# Patient Record
Sex: Female | Born: 1960 | Race: White | Hispanic: No | Marital: Married | Smoking: Never smoker
Health system: Southern US, Community
[De-identification: ages and names within clinical notes are randomized; demographics above are authoritative.]

## PROBLEM LIST (undated history)

## (undated) DIAGNOSIS — M069 Rheumatoid arthritis, unspecified: Secondary | ICD-10-CM

## (undated) DIAGNOSIS — R12 Heartburn: Secondary | ICD-10-CM

## (undated) DIAGNOSIS — F419 Anxiety disorder, unspecified: Secondary | ICD-10-CM

## (undated) DIAGNOSIS — R300 Dysuria: Secondary | ICD-10-CM

## (undated) DIAGNOSIS — G4733 Obstructive sleep apnea (adult) (pediatric): Secondary | ICD-10-CM

## (undated) DIAGNOSIS — G2581 Restless legs syndrome: Secondary | ICD-10-CM

## (undated) DIAGNOSIS — E785 Hyperlipidemia, unspecified: Secondary | ICD-10-CM

## (undated) DIAGNOSIS — D869 Sarcoidosis, unspecified: Secondary | ICD-10-CM

## (undated) DIAGNOSIS — R112 Nausea with vomiting, unspecified: Secondary | ICD-10-CM

## (undated) DIAGNOSIS — E039 Hypothyroidism, unspecified: Secondary | ICD-10-CM

## (undated) DIAGNOSIS — K219 Gastro-esophageal reflux disease without esophagitis: Secondary | ICD-10-CM

## (undated) DIAGNOSIS — M1990 Unspecified osteoarthritis, unspecified site: Secondary | ICD-10-CM

## (undated) DIAGNOSIS — G43909 Migraine, unspecified, not intractable, without status migrainosus: Secondary | ICD-10-CM

## (undated) DIAGNOSIS — D759 Disease of blood and blood-forming organs, unspecified: Secondary | ICD-10-CM

## (undated) DIAGNOSIS — Z9889 Other specified postprocedural states: Secondary | ICD-10-CM

## (undated) DIAGNOSIS — I10 Essential (primary) hypertension: Secondary | ICD-10-CM

## (undated) DIAGNOSIS — E7800 Pure hypercholesterolemia, unspecified: Secondary | ICD-10-CM

## (undated) DIAGNOSIS — F32A Depression, unspecified: Secondary | ICD-10-CM

## (undated) DIAGNOSIS — E663 Overweight: Secondary | ICD-10-CM

## (undated) DIAGNOSIS — I82409 Acute embolism and thrombosis of unspecified deep veins of unspecified lower extremity: Secondary | ICD-10-CM

## (undated) HISTORY — DX: Hypothyroidism, unspecified: E03.9

## (undated) HISTORY — DX: Anxiety disorder, unspecified: F41.9

## (undated) HISTORY — DX: Restless legs syndrome: G25.81

## (undated) HISTORY — PX: OTHER SURGICAL HISTORY: SHX169

## (undated) HISTORY — PX: SPLENECTOMY: SUR1306

## (undated) HISTORY — DX: Rheumatoid arthritis, unspecified: M06.9

## (undated) HISTORY — DX: Obstructive sleep apnea (adult) (pediatric): G47.33

## (undated) HISTORY — DX: Heartburn: R12

## (undated) HISTORY — DX: Overweight: E66.3

## (undated) HISTORY — DX: Hyperlipidemia, unspecified: E78.5

## (undated) HISTORY — DX: Acute embolism and thrombosis of unspecified deep veins of unspecified lower extremity: I82.409

## (undated) HISTORY — DX: Dysuria: R30.0

## (undated) HISTORY — DX: Essential (primary) hypertension: I10

## (undated) HISTORY — DX: Unspecified osteoarthritis, unspecified site: M19.90

## (undated) HISTORY — DX: Depression, unspecified: F32.A

## (undated) HISTORY — DX: Sarcoidosis, unspecified: D86.9

## (undated) HISTORY — DX: Pure hypercholesterolemia, unspecified: E78.00

## (undated) HISTORY — DX: Migraine, unspecified, not intractable, without status migrainosus: G43.909

---

## 1990-11-17 HISTORY — PX: LYMPH NODE BIOPSY: SHX201

## 1999-08-21 ENCOUNTER — Other Ambulatory Visit: Admission: RE | Admit: 1999-08-21 | Discharge: 1999-08-21 | Payer: Self-pay | Admitting: Family Medicine

## 2001-03-30 ENCOUNTER — Ambulatory Visit (HOSPITAL_COMMUNITY): Admission: RE | Admit: 2001-03-30 | Discharge: 2001-03-30 | Payer: Self-pay | Admitting: Unknown Physician Specialty

## 2001-03-30 ENCOUNTER — Encounter: Payer: Self-pay | Admitting: Unknown Physician Specialty

## 2001-11-17 HISTORY — PX: TOTAL ABDOMINAL HYSTERECTOMY: SHX209

## 2002-04-13 ENCOUNTER — Ambulatory Visit (HOSPITAL_COMMUNITY): Admission: RE | Admit: 2002-04-13 | Discharge: 2002-04-13 | Payer: Self-pay | Admitting: Unknown Physician Specialty

## 2002-04-13 ENCOUNTER — Encounter: Payer: Self-pay | Admitting: Unknown Physician Specialty

## 2003-04-20 ENCOUNTER — Encounter: Payer: Self-pay | Admitting: Unknown Physician Specialty

## 2003-04-20 ENCOUNTER — Ambulatory Visit (HOSPITAL_COMMUNITY): Admission: RE | Admit: 2003-04-20 | Discharge: 2003-04-20 | Payer: Self-pay | Admitting: Unknown Physician Specialty

## 2006-05-04 ENCOUNTER — Ambulatory Visit: Admission: RE | Admit: 2006-05-04 | Discharge: 2006-05-04 | Payer: Self-pay | Admitting: Internal Medicine

## 2006-05-21 ENCOUNTER — Ambulatory Visit: Payer: Self-pay | Admitting: Pulmonary Disease

## 2006-06-19 ENCOUNTER — Ambulatory Visit: Payer: Self-pay | Admitting: Pulmonary Disease

## 2006-08-13 ENCOUNTER — Ambulatory Visit (HOSPITAL_COMMUNITY): Admission: RE | Admit: 2006-08-13 | Discharge: 2006-08-13 | Payer: Self-pay | Admitting: Internal Medicine

## 2007-04-16 ENCOUNTER — Ambulatory Visit: Payer: Self-pay | Admitting: Pulmonary Disease

## 2007-12-30 DIAGNOSIS — E039 Hypothyroidism, unspecified: Secondary | ICD-10-CM

## 2007-12-30 DIAGNOSIS — I10 Essential (primary) hypertension: Secondary | ICD-10-CM

## 2007-12-30 DIAGNOSIS — G4733 Obstructive sleep apnea (adult) (pediatric): Secondary | ICD-10-CM | POA: Insufficient documentation

## 2007-12-30 DIAGNOSIS — E663 Overweight: Secondary | ICD-10-CM | POA: Insufficient documentation

## 2007-12-30 DIAGNOSIS — D869 Sarcoidosis, unspecified: Secondary | ICD-10-CM | POA: Insufficient documentation

## 2008-01-25 ENCOUNTER — Ambulatory Visit (HOSPITAL_COMMUNITY): Admission: RE | Admit: 2008-01-25 | Discharge: 2008-01-25 | Payer: Self-pay | Admitting: Obstetrics and Gynecology

## 2009-02-06 ENCOUNTER — Encounter: Admission: RE | Admit: 2009-02-06 | Discharge: 2009-02-06 | Payer: Self-pay | Admitting: Otolaryngology

## 2009-09-11 ENCOUNTER — Encounter (HOSPITAL_COMMUNITY): Admission: RE | Admit: 2009-09-11 | Discharge: 2009-10-11 | Payer: Self-pay | Admitting: Oncology

## 2009-09-11 ENCOUNTER — Ambulatory Visit (HOSPITAL_COMMUNITY): Payer: Self-pay | Admitting: Oncology

## 2010-01-14 ENCOUNTER — Ambulatory Visit (HOSPITAL_COMMUNITY): Payer: Self-pay | Admitting: Oncology

## 2010-01-14 ENCOUNTER — Encounter (HOSPITAL_COMMUNITY): Admission: RE | Admit: 2010-01-14 | Discharge: 2010-02-13 | Payer: Self-pay | Admitting: Oncology

## 2010-09-18 ENCOUNTER — Ambulatory Visit (HOSPITAL_COMMUNITY): Admission: RE | Admit: 2010-09-18 | Discharge: 2010-09-18 | Payer: Self-pay | Admitting: Internal Medicine

## 2011-02-06 LAB — CBC
Hemoglobin: 12.6 g/dL (ref 12.0–15.0)
RDW: 14.1 % (ref 11.5–15.5)
WBC: 12.3 10*3/uL — ABNORMAL HIGH (ref 4.0–10.5)

## 2011-02-06 LAB — DIFFERENTIAL
Basophils Absolute: 0.1 10*3/uL (ref 0.0–0.1)
Lymphocytes Relative: 37 % (ref 12–46)
Lymphs Abs: 4.5 10*3/uL — ABNORMAL HIGH (ref 0.7–4.0)
Monocytes Absolute: 0.9 10*3/uL (ref 0.1–1.0)
Neutro Abs: 6.6 10*3/uL (ref 1.7–7.7)

## 2011-02-20 LAB — IRON AND TIBC
Iron: 75 ug/dL (ref 42–135)
TIBC: 277 ug/dL (ref 250–470)

## 2011-02-20 LAB — CBC
HCT: 36.4 % (ref 36.0–46.0)
Hemoglobin: 12.3 g/dL (ref 12.0–15.0)
MCHC: 33.8 g/dL (ref 30.0–36.0)
RBC: 3.91 MIL/uL (ref 3.87–5.11)

## 2011-02-20 LAB — COMPREHENSIVE METABOLIC PANEL
ALT: 17 U/L (ref 0–35)
Alkaline Phosphatase: 69 U/L (ref 39–117)
BUN: 14 mg/dL (ref 6–23)
CO2: 25 mEq/L (ref 19–32)
GFR calc non Af Amer: >60 mL/min (ref >60)
Glucose, Bld: 115 mg/dL — ABNORMAL HIGH (ref 70–99)
Potassium: 3.6 mEq/L (ref 3.5–5.1)
Sodium: 139 mEq/L (ref 135–145)

## 2011-02-20 LAB — DIFFERENTIAL
Basophils Absolute: 0.1 10*3/uL (ref 0.0–0.1)
Basophils Relative: 1 % (ref 0–1)
Eosinophils Absolute: 0.2 10*3/uL (ref 0.0–0.7)
Neutrophils Relative %: 45 % (ref 43–77)

## 2011-02-20 LAB — LACTATE DEHYDROGENASE: LDH: 150 U/L (ref 94–250)

## 2011-02-20 LAB — ANGIOTENSIN CONVERTING ENZYME: Angiotensin-Converting Enzyme: 35 U/L (ref 9–67)

## 2011-03-02 IMAGING — MG MM DIGITAL SCREENING
5 series · 5 of 5 positions shown · non-contrast
Comparison: none

DG SCREEN MAMMOGRAM BILATERAL
Bilateral CC and MLO view(s) were taken.

DIGITAL SCREENING MAMMOGRAM WITH CAD:
The breast tissue is almost entirely fatty.  No masses or malignant type calcifications are 
identified.  Compared with prior studies.
Images were processed with CAD.

[L CC]
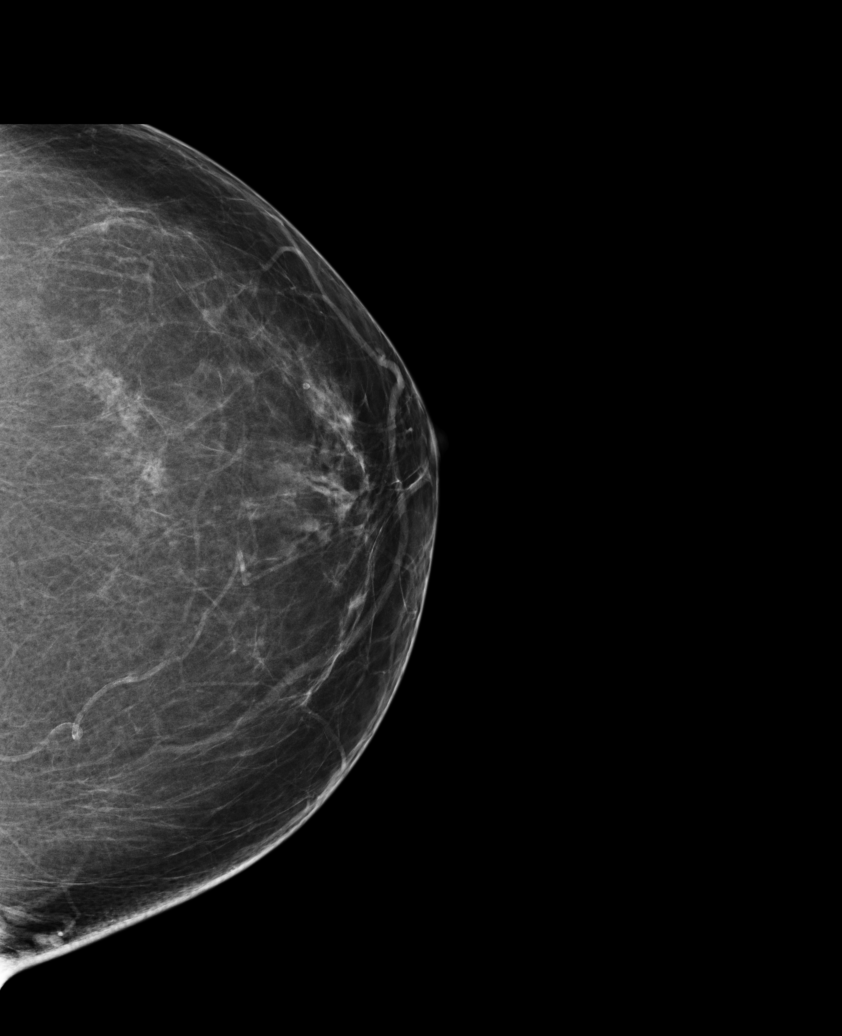

[L MLO (1 of 2)]
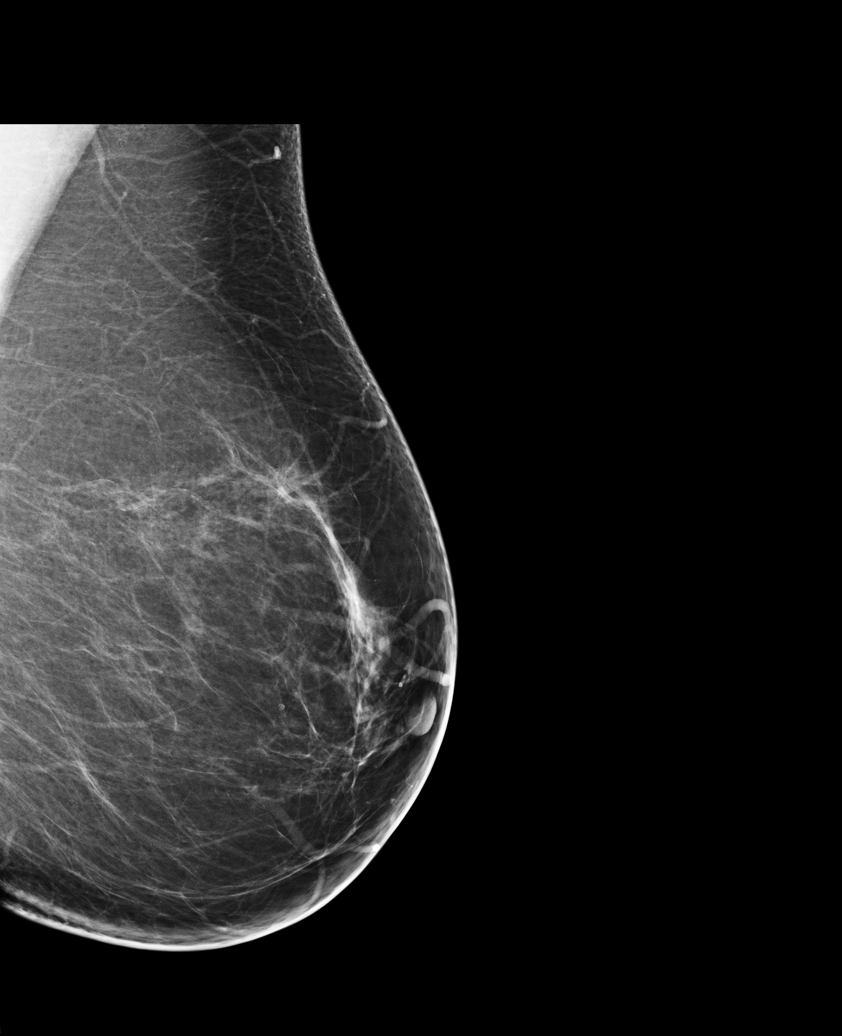

[R CC]
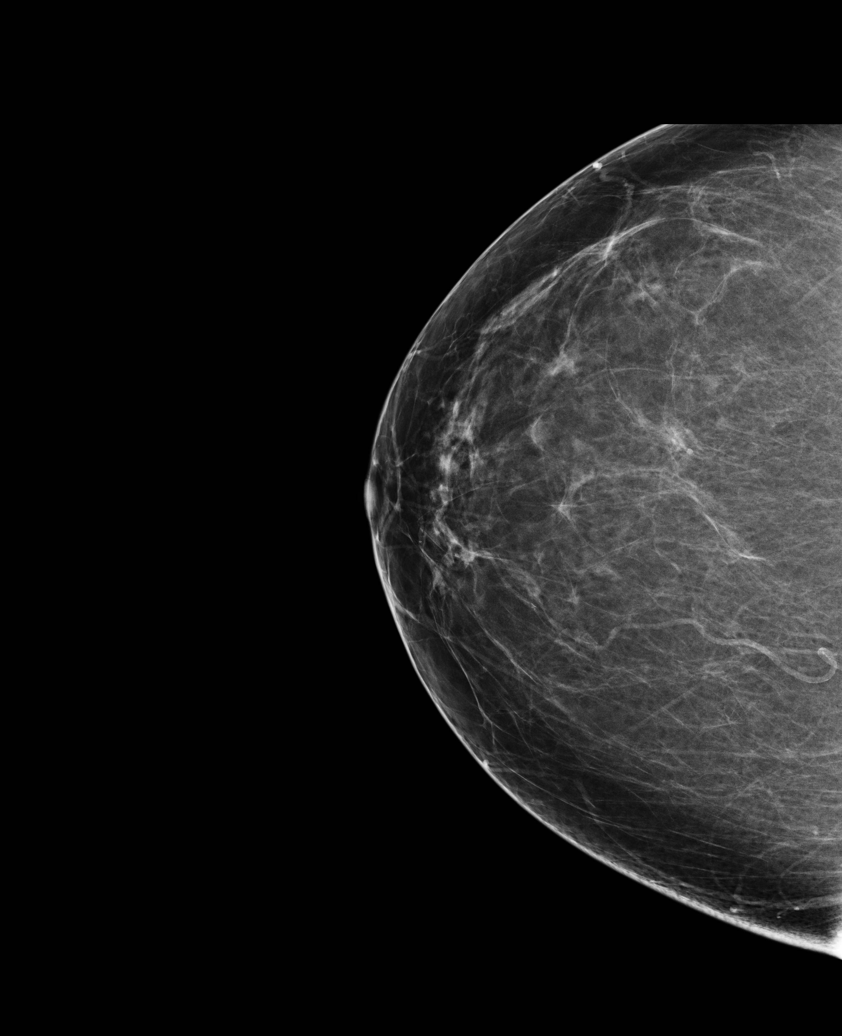

[R MLO]
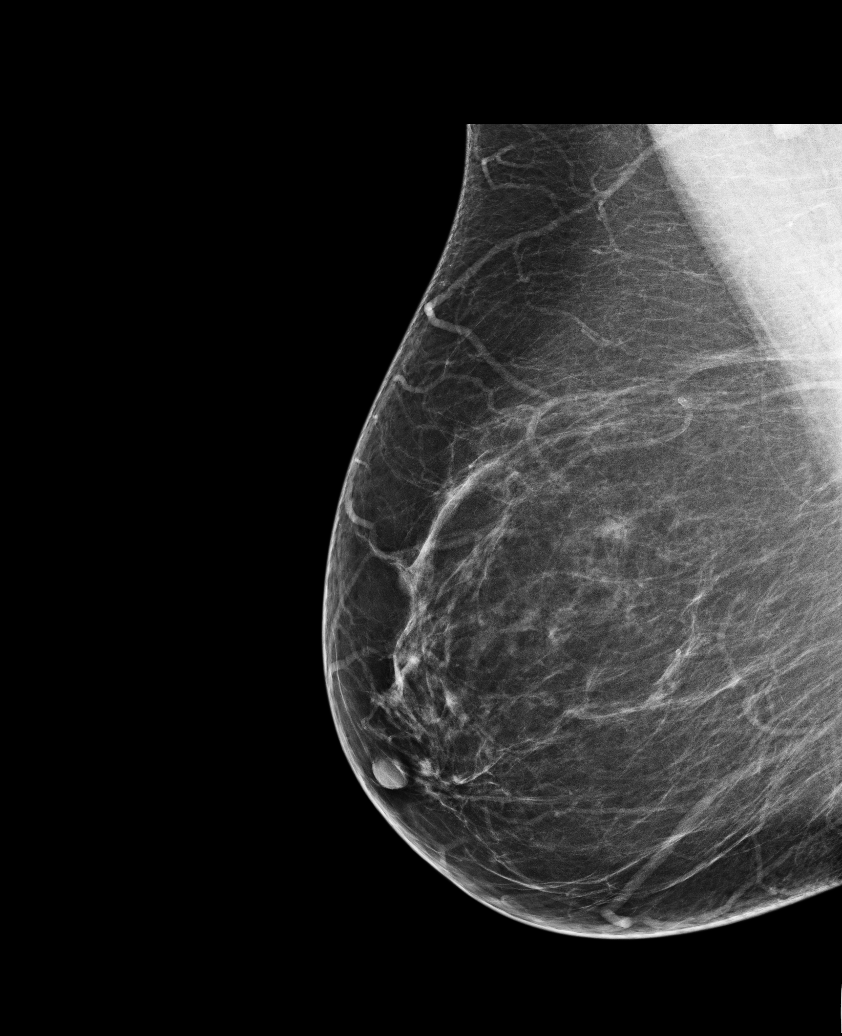

[L MLO (2 of 2)]
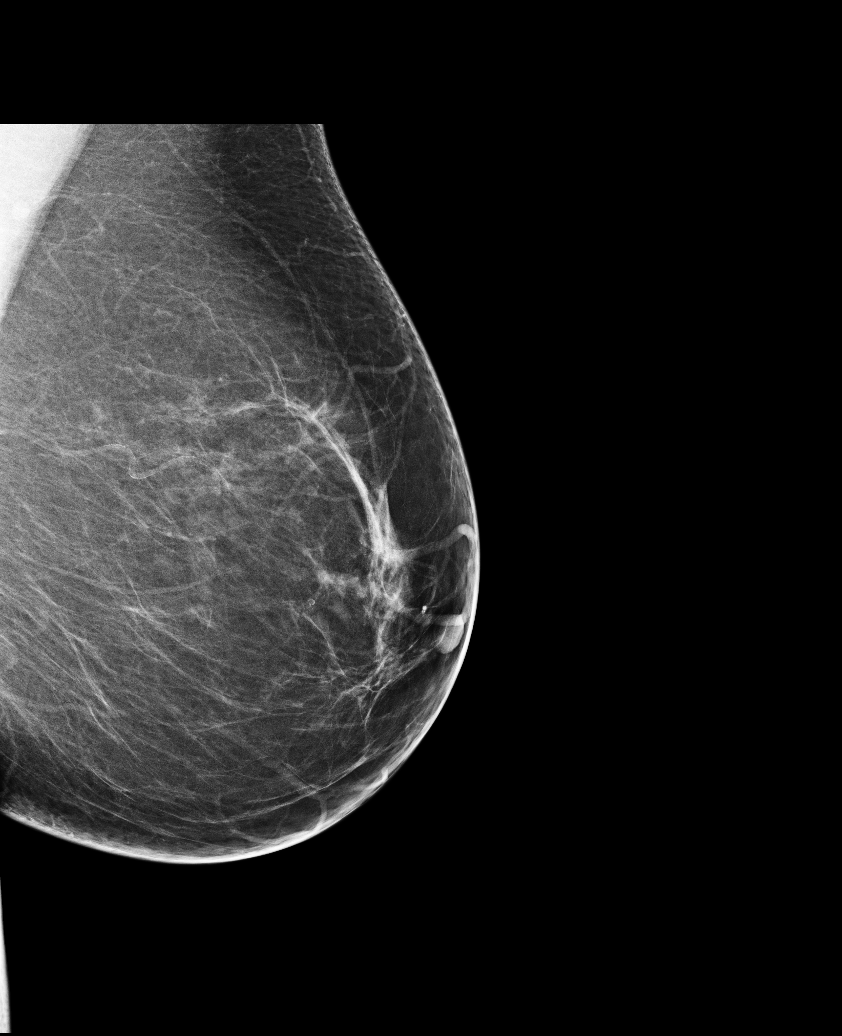

[5 of 5 positions shown; findings below may reference images not displayed]

IMPRESSION: No specific mammographic evidence of malignancy.  Next screening mammogram is recommended in one 
year.

A result letter of this screening mammogram will be mailed directly to the patient.

ASSESSMENT: Negative - BI-RADS 1

Screening mammogram in 1 year.
,

## 2011-04-04 NOTE — Assessment & Plan Note (Signed)
[facility]                               PULMONARY OFFICE NOTE   NAME:HATFIELDNusaiba, Guallpa                    MRN:          [id number]  DATE:06/19/2006                            DOB:          02-02-1961    HISTORY OF PRESENT ILLNESS:  The patient is a 50 year old white female who I  have been asked to see for obstructive sleep apnea.  She has recently  undergone nocturnal polysomnography on May 04, 2006, where on a split night  study she was found to have a Respiratory Disturbance Index with 26 events  per hour with O2 desaturation as low as 88%.  She was then placed on CPAP  and titrated to a level of 7 with what appears to be good control.  Patient  states that she typically goes to bed between 9:30 and 10:00 and gets up at  6:00 a.m. to start her day.  She gets tired in the morning upon arising.  She has been noted to have loud snoring and pauses in her breathing during  sleep.  Her husband sleeps in the living room on many nights.  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  She will get sleepy while  driving and will fall asleep with TV and movies.  It should be noted that  she does have a history of hypothyroidism, and Dr. Ouida Sills recently just  increased her Synthroid.  Patient also notes that she has gained  approximately 20-30 pounds over the last year.   PAST MEDICAL HISTORY:  1.  Hypertension.  2.  History of sarcoidosis, which has resolved.  3.  Status post splenectomy.  4.  Status post tubal ligation and hysterectomy.  5.  History of hyperthyroidism, as stated above.   MEDICATIONS:  1.  Synthroid 0.75 mg daily.  2.  Lexapro 20 mg daily.  3.  Mirapex of unknown dosage, nightly.   Patient has no known drug allergies.   SOCIAL HISTORY:  She is married and has children.  She has never smoked.   FAMILY HISTORY:  Remarkable for her having had  emphysema, and her son and  mother having had asthma.  Her mother and father also had heart disease.   REVIEW OF SYSTEMS:  As per history of present illness.  Also, see patient  intake form in the chart.   PHYSICAL EXAMINATION:  GENERAL:  She is an overweight white female in no  acute distress.  VITAL SIGNS:  Blood pressure 110/76, pulse 69, temperature 98.3.  Weight is  197 pounds.  O2 saturation on room air is 99%.  HEENT:  Pupils are equal, round and reactive to light and accommodation.  Extraocular muscles are intact.  Nares are somewhat narrow but patent.  Oropharynx:  She has a very small oral opening that appears to be very  crowded with elongation of the soft palate and uvula.  NECK:  Supple without JVD or lymphadenopathy.  There is no palpable  thyromegaly.  CHEST:  Clear.  CARDIAC:  Regular rate and  rhythm.  No murmurs, rubs or gallops.  ABDOMEN:  Soft, nontender.  Normoactive bowel sounds.  GENITAL/BREASTS/RECTAL:  Not done and not indicated.  EXTREMITIES:  Lower extremities are without edema.  Good pulses distally.  No calf tenderness.  NEUROLOGIC:  She is alert and oriented and appears to have no observable  motor defects.   IMPRESSION:  Moderate obstructive sleep apnea documented by nocturnal  polysomnography.  Patient is clearly very symptomatic.  She is overweight  and has abnormal upper airway anatomy.  I had a long discussion with her  about sleep apnea, including both the short term quality of life risk and  the long term cardiovascular risk.  Although surgery is an option, I do not  think that would be in her best interest, and I have encouraged her to try  CPAP while she is working on weight loss.  She is agreeable to this;  however, would like to take a few days to think about it.   PLAN:  I have recommended to the patient a CPAP at 7 cm while working on  weight loss.  She is open to this idea; however, would like to go by  [facility] and look at the  various masks to see what I'm getting  into.  She will then call me if she decides to proceed.  I will see her  back in followup in approximately four weeks after initiating CPAP.                                   Barbaraann Share, MD, FCCP   [facility]  DD:  06/21/2006  DT:  06/21/2006  Job #:  [id number]   cc:   Kingsley Callander. Ouida Sills, MD

## 2011-04-04 NOTE — Procedures (Signed)
Samantha Eaton, Samantha Eaton             ACCOUNT NO.:  [id number]   MEDICAL RECORD NO.:  [id number]          PATIENT TYPE:  OUT   LOCATION:  SLEEP LAB                     FACILITY:  [facility]   PHYSICIAN:  Marcelyn Bruins, M.D. [facility] DATE OF BIRTH:  Mar 08, 1961   DATE OF STUDY:  05/04/2006                              NOCTURNAL POLYSOMNOGRAM   REFERRING PHYSICIAN:  Dr. Carylon Perches   INDICATION FOR THE STUDY:  Hypersomnia with with sleep apnea.   EPWORTH SCORE:  13.   SLEEP ARCHITECTURE:  Patient had a total sleep time of 324 minutes with  decreased REM and never achieved slow wave sleep.  Sleep onset latency was  normal and REM onset was very prolonged at 321 minutes.  Sleep efficiency  was decreased at 84%.   RESPIRATORY DATA:  The patient underwent split night protocol where she was  found to have 79 obstructive events in the first 181 minutes of sleep.  This  gave her extrapolated respiratory disturbance index over the first half of  the night of 26 events per hour.  The events were not positional but there  was moderate to loud snoring noted throughout.  By protocol the patient was  then placed on a medium Respironics nasal CPAP mask and titration was  initiated.  At a final pressure of 7 cm she seemed to have excellent control  both her obstructive events and snoring even through REM.   OXYGEN DATA:  There was O2 desaturation as low as 88% with the patient's  obstructive events.   CARDIAC DATA:  No clinically significant cardiac arrhythmias.   MOVEMENT/PARASOMNIAS:  The patient was found to have 56 leg jerks with one  per hour resulting in arousal or awakening.   IMPRESSION/RECOMMENDATION:  1.  Split night study reveals moderate obstructive sleep apnea with a      respiratory disturbance index of 26 events per hour and O2 desaturation      as low as 88%.  By protocol the patient was placed on a medium      Respironics nasal CPAP mask and titrated to a final pressure of 7 cm of      water  with excellent control of her obstructive      events even through REM.  1.  Small numbers of leg jerks with very little clinical impact.                                         ______________________________  Marcelyn Bruins, M.D. [facility]  Diplomate, American Board of Sleep  Medicine  2.  [facility]/MEDQ  D:  05/22/2006 12:15:13  T:  05/22/2006 12:40:44  Job:  [id number]

## 2011-08-20 ENCOUNTER — Other Ambulatory Visit: Payer: Self-pay | Admitting: Adult Health

## 2011-08-20 DIAGNOSIS — Z139 Encounter for screening, unspecified: Secondary | ICD-10-CM

## 2011-09-22 ENCOUNTER — Other Ambulatory Visit: Payer: Self-pay | Admitting: Obstetrics & Gynecology

## 2011-09-22 ENCOUNTER — Ambulatory Visit (HOSPITAL_COMMUNITY)
Admission: RE | Admit: 2011-09-22 | Discharge: 2011-09-22 | Disposition: A | Discharge status: Home / Self Care | Payer: BC Managed Care – PPO | Source: Ambulatory Visit | Attending: Adult Health | Admitting: Adult Health

## 2011-09-22 DIAGNOSIS — Z139 Encounter for screening, unspecified: Secondary | ICD-10-CM

## 2011-09-22 DIAGNOSIS — Z1231 Encounter for screening mammogram for malignant neoplasm of breast: Secondary | ICD-10-CM | POA: Insufficient documentation

## 2011-10-06 ENCOUNTER — Telehealth: Payer: Self-pay | Admitting: Gastroenterology

## 2011-10-06 ENCOUNTER — Ambulatory Visit: Payer: BC Managed Care – PPO | Admitting: Gastroenterology

## 2011-10-06 NOTE — Telephone Encounter (Signed)
Pt was a no show

## 2011-10-06 NOTE — Telephone Encounter (Signed)
Let referring provider know patient no showed.

## 2011-10-22 ENCOUNTER — Ambulatory Visit: Payer: BC Managed Care – PPO | Admitting: Gastroenterology

## 2011-10-22 ENCOUNTER — Encounter: Payer: Self-pay | Admitting: Internal Medicine

## 2011-10-23 ENCOUNTER — Encounter: Payer: Self-pay | Admitting: Gastroenterology

## 2011-10-23 ENCOUNTER — Ambulatory Visit (INDEPENDENT_AMBULATORY_CARE_PROVIDER_SITE_OTHER): Payer: BC Managed Care – PPO | Admitting: Gastroenterology

## 2011-10-23 VITALS — BP 135/90 | HR 85 | Temp 98.4°F | Ht 65.0 in | Wt 212.8 lb

## 2011-10-23 DIAGNOSIS — Z1211 Encounter for screening for malignant neoplasm of colon: Secondary | ICD-10-CM

## 2011-10-23 DIAGNOSIS — K649 Unspecified hemorrhoids: Secondary | ICD-10-CM | POA: Insufficient documentation

## 2011-10-23 NOTE — Assessment & Plan Note (Signed)
Here for colon cancer screening. Occasional constipation, managed through dietary measures. Occasional bright red blood per rectum felt to be due to hemorrhoids. No prior colonoscopy. Recommend colonoscopy in the near future. Patient requests procedure to be done by Dr. Jena Gauss as her husband as seen him before.  I have discussed the risks, alternatives, benefits with regards to but not limited to the risk of reaction to medication, bleeding, infection, perforation and the patient is agreeable to proceed. Written consent to be obtained.

## 2011-10-23 NOTE — Progress Notes (Signed)
Primary Care Physician:  Carylon Perches, MD Referring Physician: Peter Garter, NP Primary Gastroenterologist:  Roetta Sessions, MD    Chief Complaint  Patient presents with  . Colonoscopy  . Hemorrhoids    HPI:  Samantha Eaton is a 50 y.o. female here to schedule colonoscopy. She has never had one. Recently he had a skin tag removed from her perianal area by Dr. Emelda Fear.  Complains of occasionally constipation. Usually controls with her diet. Associated with occasional brbpr. No abdominal pain, heartburn, vomiting, dysphagia. No weight loss.  Just started mobic last Monday. Was taking advil on daily basis before.   Current Outpatient Prescriptions  Medication Sig Dispense Refill  . cefdinir (OMNICEF) 300 MG capsule Take 300 mg by mouth daily.       Marland Kitchen HYDROcodone-homatropine (HYCODAN) 5-1.5 MG/5ML syrup Take 5 mLs by mouth every 6 (six) hours as needed.       Marland Kitchen levothyroxine (SYNTHROID, LEVOTHROID) 75 MCG tablet Take 75 mcg by mouth daily.       . meloxicam (MOBIC) 7.5 MG tablet Take 7.5 mg by mouth daily.       . pramipexole (MIRAPEX) 0.5 MG tablet Take 0.5 mg by mouth 3 (three) times daily.       . simvastatin (ZOCOR) 20 MG tablet Take 20 mg by mouth at bedtime.         Allergies as of 10/23/2011  . (No Known Allergies)    Past Medical History  Diagnosis Date  . Overweight   . Obstructive sleep apnea   . Sarcoidosis     in remission for over ten years  . HTN (hypertension)     not on medication  . Hypothyroidism   . Migraines   . RLS (restless legs syndrome)   . Anxiety   . DVT (deep venous thrombosis)     right leg, years ago  . Hemorrhoids   . Constipation     Past Surgical History  Procedure Date  . Cesarean section     x 2  . Skin tag removed     perianal, Dr. Emelda Fear  . Spleen removed 1992    secondary to sacrcoidosis  . Total abdominal hysterectomy 2003    Left ovary removed  . Bilateral tubal ligation     Family History  Problem Relation Age of  Onset  . Colon cancer Neg Hx   . Breast cancer Neg Hx   . Ovarian cancer Neg Hx   . Liver disease Maternal Grandmother     ???  . Heart attack Mother 49    deceased  . Heart attack Father 41    deceased    History   Social History  . Marital Status: Married    Spouse Name: N/A    Number of Children: 2  . Years of Education: N/A   Occupational History  . 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 Smoking status: Never Smoker   . Smokeless tobacco: Not on file  . Alcohol Use: No  . Drug Use: No  . Sexually Active: Not on file   Other Topics Concern  . Not on file   Social History Narrative   Son, deceased age 50, 73.      ROS:  General: Negative for anorexia, weight loss, fever, chills, fatigue, weakness. Eyes: Negative for vision changes.  ENT: Negative for hoarseness, difficulty swallowing , nasal congestion. CV: Negative for chest pain, angina, palpitations, dyspnea on  exertion, peripheral edema.  Respiratory: Negative for dyspnea at rest, dyspnea on exertion, sputum, wheezing. Current upper respiratory infection, cough, nonproductive. On second round of antibiotics. GI: See history of present illness. GU:  Negative for dysuria, hematuria, urinary incontinence, urinary frequency, nocturnal urination.  MS: Negative for joint pain, low back pain.  Derm: Negative for rash or itching.  Neuro: Negative for weakness, abnormal sensation, seizure, frequent headaches, memory loss, confusion.  Psych: Negative for anxiety, depression, suicidal ideation, hallucinations.  Endo: Negative for unusual weight change.  Heme: Negative for bruising or bleeding. Allergy: Negative for rash or hives.    Physical Examination:  BP 135/90  Pulse 85  Temp(Src) 98.4 F (36.9 C) (Temporal)  Ht 5\' 5"  (1.651 m)  Wt 212 lb 12.8 oz (96.525 kg)  BMI 35.41 kg/m2   General: Well-nourished, well-developed in no acute distress.  Head:  Normocephalic, atraumatic.   Eyes: Conjunctiva pink, no icterus. Mouth: Oropharyngeal mucosa moist and pink , no lesions erythema or exudate. Neck: Supple without thyromegaly, masses, or lymphadenopathy.  Lungs: Clear to auscultation bilaterally.  Heart: Regular rate and rhythm, no murmurs rubs or gallops.  Abdomen: Bowel sounds are normal, nontender, nondistended, no hepatosplenomegaly or masses, no abdominal bruits or    hernia , no rebound or guarding.   Rectal: Deferred at time of colonoscopy Extremities: No lower extremity edema. No clubbing or deformities.  Neuro: Alert and oriented x 4 , grossly normal neurologically.  Skin: Warm and dry, no rash or jaundice.   Psych: Alert and cooperative, normal mood and affect.

## 2011-10-23 NOTE — Patient Instructions (Signed)
We have scheduled you for a colonoscopy. Please see separate instructions. 

## 2011-10-23 NOTE — Progress Notes (Signed)
Cc to PCP 

## 2011-10-28 NOTE — Progress Notes (Signed)
REVIEWED.  

## 2011-11-07 ENCOUNTER — Encounter (HOSPITAL_COMMUNITY): Payer: Self-pay

## 2011-11-19 MED ORDER — SODIUM CHLORIDE 0.45 % IV SOLN
Freq: Once | INTRAVENOUS | Status: AC
  Administered 2011-11-20: 08:00:00 via INTRAVENOUS

## 2011-11-20 ENCOUNTER — Encounter (HOSPITAL_COMMUNITY): Payer: Self-pay | Admitting: *Deleted

## 2011-11-20 ENCOUNTER — Ambulatory Visit (HOSPITAL_COMMUNITY)
Admission: RE | Admit: 2011-11-20 | Discharge: 2011-11-20 | Disposition: A | Discharge status: Home / Self Care | Payer: BC Managed Care – PPO | Source: Ambulatory Visit | Attending: Internal Medicine | Admitting: Internal Medicine

## 2011-11-20 ENCOUNTER — Encounter (HOSPITAL_COMMUNITY)
Admission: RE | Disposition: A | Discharge status: Home / Self Care | Payer: Self-pay | Source: Ambulatory Visit | Attending: Internal Medicine

## 2011-11-20 DIAGNOSIS — K644 Residual hemorrhoidal skin tags: Secondary | ICD-10-CM | POA: Insufficient documentation

## 2011-11-20 DIAGNOSIS — K648 Other hemorrhoids: Secondary | ICD-10-CM | POA: Insufficient documentation

## 2011-11-20 DIAGNOSIS — K649 Unspecified hemorrhoids: Secondary | ICD-10-CM

## 2011-11-20 DIAGNOSIS — Z1211 Encounter for screening for malignant neoplasm of colon: Secondary | ICD-10-CM | POA: Insufficient documentation

## 2011-11-20 DIAGNOSIS — Z6834 Body mass index (BMI) 34.0-34.9, adult: Secondary | ICD-10-CM | POA: Insufficient documentation

## 2011-11-20 DIAGNOSIS — I10 Essential (primary) hypertension: Secondary | ICD-10-CM | POA: Insufficient documentation

## 2011-11-20 DIAGNOSIS — E669 Obesity, unspecified: Secondary | ICD-10-CM | POA: Insufficient documentation

## 2011-11-20 HISTORY — PX: COLONOSCOPY: SHX5424

## 2011-11-20 SURGERY — COLONOSCOPY
Anesthesia: Moderate Sedation

## 2011-11-20 MED ORDER — MIDAZOLAM HCL 5 MG/5ML IJ SOLN
INTRAMUSCULAR | Status: AC
  Filled 2011-11-20: qty 10

## 2011-11-20 MED ORDER — MIDAZOLAM HCL 5 MG/5ML IJ SOLN
INTRAMUSCULAR | Status: DC | PRN
  Administered 2011-11-20: 2 mg via INTRAVENOUS
  Administered 2011-11-20: 1 mg via INTRAVENOUS

## 2011-11-20 MED ORDER — MEPERIDINE HCL 100 MG/ML IJ SOLN
INTRAMUSCULAR | Status: AC
  Filled 2011-11-20: qty 2

## 2011-11-20 MED ORDER — MEPERIDINE HCL 100 MG/ML IJ SOLN
INTRAMUSCULAR | Status: DC | PRN
  Administered 2011-11-20: 25 mg via INTRAVENOUS
  Administered 2011-11-20: 50 mg via INTRAVENOUS

## 2011-11-20 NOTE — H&P (Signed)
  I have seen & examined the patient prior to the procedure(s) today and reviewed the history and physical/consultation.  There have been no changes.  After consideration of the risks, benefits, alternatives and imponderables, the patient has consented to the procedure(s).   

## 2011-11-20 NOTE — Op Note (Signed)
[facility] [address] [place], [place]  [address]  COLONOSCOPY PROCEDURE REPORT  PATIENT:  Samantha Eaton, Samantha Eaton  MR#:  [id number] BIRTHDATE:  1961/07/01, 50 yrs. old  GENDER:  female ENDOSCOPIST:  R. Roetta Sessions, MD FACP [facility] REF. BY:          Dr. Emelda Fear PROCEDURE DATE:  11/20/2011 PROCEDURE:  Screening colonoscopy  INDICATIONS:  first ever average risk screening examination  INFORMED CONSENT:  The risks, benefits, alternatives and imponderables including but not limited to bleeding, perforation as well as the possibility of a missed lesion have been reviewed. The potential for biopsy, lesion removal, etc. have also been discussed.  Questions have been answered.  All parties agreeable. Please see the history and physical in the medical record for more information.  MEDICATIONS:  Versed 3 mg IV Demerol 75 mg IV in divided doses.  DESCRIPTION OF PROCEDURE:  After a digital rectal exam was performed, the EC-[id number] ([id number]) colonoscope was advanced from the anus through the rectum and colon to the area of the cecum, ileocecal valve and appendiceal orifice.  The cecum was deeply intubated.  These structures were well-seen and photographed for the record.  From the level of the cecum and ileocecal valve, the scope was slowly and cautiously withdrawn.  The mucosal surfaces were carefully surveyed utilizing scope tip deflection to facilitate fold flattening as needed.  The scope was pulled down into the rectum where a thorough examination including retroflexion was performed. <<PROCEDUREIMAGES>>  FINDINGS:  good preparation. Anal canal and external hemorrhoids; otherwise normal rectal and colonic mucosa.  THERAPEUTIC / DIAGNOSTIC MANEUVERS PERFORMED:     none  COMPLICATIONS:  none  CECAL WITHDRAWAL TIME:    9 minutes  IMPRESSION:  Anal canal hemorrhoids; otherwise normal rectum and colon  RECOMMENDATIONS:     Repeat screening colonoscopy in 10  years  ______________________________ R. Roetta Sessions, MD Caleen Essex  CC:  Carylon Perches, MD  n. eSIGNED:   R. Roetta Sessions at 11/20/2011 08:59 AM  Mirian Capuchin, [phone]

## 2011-11-27 ENCOUNTER — Encounter (HOSPITAL_COMMUNITY): Payer: Self-pay | Admitting: Internal Medicine

## 2012-09-07 ENCOUNTER — Other Ambulatory Visit: Payer: Self-pay | Admitting: Adult Health

## 2012-09-07 DIAGNOSIS — Z139 Encounter for screening, unspecified: Secondary | ICD-10-CM

## 2012-09-24 ENCOUNTER — Ambulatory Visit (HOSPITAL_COMMUNITY)
Admission: RE | Admit: 2012-09-24 | Discharge: 2012-09-24 | Disposition: A | Discharge status: Home / Self Care | Payer: BC Managed Care – PPO | Source: Ambulatory Visit | Attending: Adult Health | Admitting: Adult Health

## 2012-09-24 DIAGNOSIS — Z139 Encounter for screening, unspecified: Secondary | ICD-10-CM

## 2012-09-24 DIAGNOSIS — Z1231 Encounter for screening mammogram for malignant neoplasm of breast: Secondary | ICD-10-CM | POA: Insufficient documentation

## 2013-09-19 ENCOUNTER — Encounter (INDEPENDENT_AMBULATORY_CARE_PROVIDER_SITE_OTHER): Payer: Self-pay

## 2013-09-19 ENCOUNTER — Encounter: Payer: Self-pay | Admitting: Adult Health

## 2013-09-19 ENCOUNTER — Ambulatory Visit (INDEPENDENT_AMBULATORY_CARE_PROVIDER_SITE_OTHER): Payer: BC Managed Care – PPO | Admitting: Adult Health

## 2013-09-19 VITALS — BP 134/96 | HR 76 | Ht 64.0 in | Wt 212.5 lb

## 2013-09-19 DIAGNOSIS — E669 Obesity, unspecified: Secondary | ICD-10-CM

## 2013-09-19 DIAGNOSIS — Z01419 Encounter for gynecological examination (general) (routine) without abnormal findings: Secondary | ICD-10-CM

## 2013-09-19 DIAGNOSIS — I10 Essential (primary) hypertension: Secondary | ICD-10-CM

## 2013-09-19 DIAGNOSIS — G2581 Restless legs syndrome: Secondary | ICD-10-CM | POA: Insufficient documentation

## 2013-09-19 DIAGNOSIS — R12 Heartburn: Secondary | ICD-10-CM

## 2013-09-19 DIAGNOSIS — E7800 Pure hypercholesterolemia, unspecified: Secondary | ICD-10-CM

## 2013-09-19 DIAGNOSIS — E039 Hypothyroidism, unspecified: Secondary | ICD-10-CM

## 2013-09-19 DIAGNOSIS — Z1212 Encounter for screening for malignant neoplasm of rectum: Secondary | ICD-10-CM

## 2013-09-19 DIAGNOSIS — F419 Anxiety disorder, unspecified: Secondary | ICD-10-CM

## 2013-09-19 HISTORY — DX: Heartburn: R12

## 2013-09-19 HISTORY — DX: Pure hypercholesterolemia, unspecified: E78.00

## 2013-09-19 LAB — HEMOCCULT GUIAC POC 1CARD (OFFICE): Fecal Occult Blood, POC: NEGATIVE

## 2013-09-19 LAB — COMPREHENSIVE METABOLIC PANEL
ALT: 17 U/L (ref 0–35)
AST: 17 U/L (ref 0–37)
Creat: 0.85 mg/dL (ref 0.50–1.10)
Sodium: 139 mEq/L (ref 135–145)
Total Bilirubin: 0.4 mg/dL (ref 0.3–1.2)

## 2013-09-19 LAB — LIPID PANEL
HDL: 51 mg/dL (ref >39)
LDL Cholesterol: 189 mg/dL — ABNORMAL HIGH (ref 0–99)
Total CHOL/HDL Ratio: 5.3 Ratio
VLDL: 30 mg/dL (ref 0–40)

## 2013-09-19 LAB — HEMOGLOBIN A1C: Hgb A1c MFr Bld: 6.1 % — ABNORMAL HIGH (ref <5.7)

## 2013-09-19 MED ORDER — ESOMEPRAZOLE MAGNESIUM 40 MG PO CPDR: 40.0000 mg | DELAYED_RELEASE_CAPSULE | Freq: Every day | ORAL | Status: DC

## 2013-09-19 MED ORDER — LEVOTHYROXINE SODIUM 75 MCG PO TABS: 75.0000 ug | ORAL_TABLET | Freq: Every day | ORAL | Status: DC

## 2013-09-19 MED ORDER — HYDROCHLOROTHIAZIDE 12.5 MG PO CAPS: 12.5000 mg | ORAL_CAPSULE | Freq: Every day | ORAL | Status: DC

## 2013-09-19 NOTE — Progress Notes (Signed)
Patient ID: Samantha Eaton, female   DOB: [birth year], 52 y.o.   MRN: [id number] History of Present Illness: Samantha Eaton is a 52 year old white female married in for physical.  Current Medications, Allergies, Past Medical History, Past Surgical History, Family History and Social History were reviewed in [facility] medical record.     Review of Systems: Patient denies any headaches, blurred vision, shortness of breath, chest pain, abdominal pain, problems with bowel movements, urination, or intercourse. She has knee problems and is having surgery in near future,no mood changes.She does complain of heart burn and increase in allergies, with runny eyes and sneezing,also increase in RLS.She gets a boil on and off on her bottom.Stressed at work, but [employer].    Physical Exam:BP 134/96  Pulse 76  Ht 5\' 4"  (1.626 m)  Wt 212 lb 8 oz (96.389 kg)  BMI 36.46 kg/m2 General:  Well developed, well nourished, no acute distress Skin:  Warm and dry Neck:  Midline trachea, normal thyroid Lungs; Clear to auscultation bilaterally Breast:  No dominant palpable mass, retraction, or nipple discharge Cardiovascular: Regular rate and rhythm Abdomen:  Soft, non tender, no hepatosplenomegaly Pelvic:  External genitalia is normal in appearance.  The vagina is normal in appearance. The cervix and uterus are absent.  No  adnexal masses or tenderness noted. Rectal: Good sphincter tone, no polyps, or hemorrhoids felt.  Hemoccult negative. Extremities:  No swelling or varicosities noted Psych:  No mood changes, alert and cooperative   Impression: Yearly exam Hypertension Hypothyroid Heartburn Anxiety  RLS  Elevated cholesterol Obesity     Plan: Refilled synthroid 75 mcg #90 i daily with 4 refills Rx Microzide 12.5 mg 1 daily #30 with 11 refills Rx Nexium 40 mg #30 1 daily with 6 refills Physical in 1 year Mammogram yearly  Colonoscopy 2023 Try Claritin daily Check BP at home and let me know  readings in about 3 weeks

## 2013-09-19 NOTE — Patient Instructions (Signed)

## 2013-09-20 LAB — CBC
MCH: 30 pg (ref 26.0–34.0)
MCV: 90.7 fL (ref 78.0–100.0)
Platelets: 561 10*3/uL — ABNORMAL HIGH (ref 150–400)
RDW: 14.2 % (ref 11.5–15.5)
WBC: 10.3 10*3/uL (ref 4.0–10.5)

## 2013-09-22 ENCOUNTER — Telehealth: Payer: Self-pay | Admitting: *Deleted

## 2013-09-23 MED ORDER — SIMVASTATIN 20 MG PO TABS: 20.0000 mg | ORAL_TABLET | Freq: Every day | ORAL | Status: DC

## 2013-09-23 MED ORDER — LEVOTHYROXINE SODIUM 88 MCG PO TABS: 88.0000 ug | ORAL_TABLET | Freq: Every day | ORAL | Status: DC

## 2013-09-23 NOTE — Addendum Note (Signed)
Addended by: Cyril Mourning A on: 09/23/2013 02:02 PM   Modules accepted: Orders

## 2013-09-23 NOTE — Telephone Encounter (Signed)
Pt aware of labs  and med change, recheck labs in 2 months

## 2013-10-04 ENCOUNTER — Telehealth: Payer: Self-pay | Admitting: *Deleted

## 2013-10-04 MED ORDER — AMLODIPINE BESYLATE 5 MG PO TABS: 5.0000 mg | ORAL_TABLET | Freq: Every day | ORAL | Status: DC

## 2013-10-04 NOTE — Telephone Encounter (Signed)
Has had BPs from 127/84 to 149/109 will add norvasc 5 mg to HCTZ 12.5, continue checking BP,( her knee surgery is Monday)She had some numbness and tingling in hands and left arm yesterday after driving, none now, has family history of heart problems will set up EKG has baseline

## 2013-10-05 ENCOUNTER — Encounter (HOSPITAL_COMMUNITY): Payer: BC Managed Care – PPO

## 2013-10-05 ENCOUNTER — Ambulatory Visit (HOSPITAL_COMMUNITY)
Admission: RE | Admit: 2013-10-05 | Discharge: 2013-10-05 | Disposition: A | Discharge status: Home / Self Care | Payer: BC Managed Care – PPO | Source: Ambulatory Visit | Attending: Adult Health | Admitting: Adult Health

## 2013-10-05 DIAGNOSIS — R9431 Abnormal electrocardiogram [ECG] [EKG]: Secondary | ICD-10-CM | POA: Insufficient documentation

## 2013-10-06 ENCOUNTER — Telehealth: Payer: Self-pay | Admitting: Adult Health

## 2013-10-06 NOTE — Telephone Encounter (Signed)
discussed EKG with Dr Ouida Sills and then called pt and told her not to worry

## 2013-11-24 ENCOUNTER — Telehealth: Payer: Self-pay | Admitting: Adult Health

## 2013-11-24 MED ORDER — SIMVASTATIN 20 MG PO TABS: 20.0000 mg | ORAL_TABLET | Freq: Every day | ORAL | Status: DC

## 2013-11-24 MED ORDER — LEVOTHYROXINE SODIUM 88 MCG PO TABS: 88.0000 ug | ORAL_TABLET | Freq: Every day | ORAL | Status: DC

## 2013-11-24 MED ORDER — AMLODIPINE BESYLATE 5 MG PO TABS: 5.0000 mg | ORAL_TABLET | Freq: Every day | ORAL | Status: DC

## 2013-11-24 MED ORDER — ESCITALOPRAM OXALATE 5 MG PO TABS: 5.0000 mg | ORAL_TABLET | Freq: Every day | ORAL | Status: DC

## 2013-11-24 NOTE — Telephone Encounter (Signed)
Pt states would like Derrek Monaco, NP to refill all her meds with 90 day supply to [facility].

## 2013-11-24 NOTE — Telephone Encounter (Signed)
Wants some rxs changed to [facility], will move lexapro,zocor and Norvasc and synthroid

## 2013-12-08 ENCOUNTER — Other Ambulatory Visit: Payer: Self-pay

## 2013-12-08 DIAGNOSIS — Z1231 Encounter for screening mammogram for malignant neoplasm of breast: Secondary | ICD-10-CM

## 2014-01-26 ENCOUNTER — Ambulatory Visit: Payer: BC Managed Care – PPO

## 2014-02-28 ENCOUNTER — Telehealth: Payer: Self-pay | Admitting: Adult Health

## 2014-02-28 MED ORDER — VALACYCLOVIR HCL 1 G PO TABS: ORAL_TABLET | ORAL | Status: DC

## 2014-02-28 NOTE — Telephone Encounter (Signed)
Refilled valtrex  

## 2014-02-28 NOTE — Telephone Encounter (Signed)
Pt requesting refill for valacyclovir for fever blister.

## 2014-03-02 ENCOUNTER — Ambulatory Visit: Payer: BC Managed Care – PPO

## 2014-03-30 ENCOUNTER — Encounter (INDEPENDENT_AMBULATORY_CARE_PROVIDER_SITE_OTHER): Payer: Self-pay

## 2014-03-30 ENCOUNTER — Ambulatory Visit
Admission: RE | Admit: 2014-03-30 | Discharge: 2014-03-30 | Disposition: A | Discharge status: Home / Self Care | Payer: BC Managed Care – PPO | Source: Ambulatory Visit

## 2014-03-30 DIAGNOSIS — Z1231 Encounter for screening mammogram for malignant neoplasm of breast: Secondary | ICD-10-CM

## 2014-03-31 ENCOUNTER — Other Ambulatory Visit: Payer: Self-pay | Admitting: Adult Health

## 2014-05-26 ENCOUNTER — Other Ambulatory Visit: Payer: Self-pay | Admitting: Orthopedic Surgery

## 2014-06-13 ENCOUNTER — Encounter (HOSPITAL_BASED_OUTPATIENT_CLINIC_OR_DEPARTMENT_OTHER): Payer: Self-pay | Admitting: *Deleted

## 2014-06-13 NOTE — Progress Notes (Signed)
Bring all medications and CPAP machine. Plans to come Thursday for BMET.

## 2014-06-15 ENCOUNTER — Telehealth: Payer: Self-pay | Admitting: Adult Health

## 2014-06-15 MED ORDER — ESCITALOPRAM OXALATE 10 MG PO TABS: 10.0000 mg | ORAL_TABLET | Freq: Every day | ORAL | Status: DC

## 2014-06-15 NOTE — Telephone Encounter (Signed)
Pt took 10 mg of lexapro instead of 5 mg and now needs refill, will refill now

## 2014-06-19 ENCOUNTER — Encounter (HOSPITAL_BASED_OUTPATIENT_CLINIC_OR_DEPARTMENT_OTHER)
Admission: RE | Admit: 2014-06-19 | Discharge: 2014-06-19 | Disposition: A | Discharge status: Home / Self Care | Payer: BC Managed Care – PPO | Source: Ambulatory Visit | Attending: Orthopedic Surgery | Admitting: Orthopedic Surgery

## 2014-06-19 LAB — BASIC METABOLIC PANEL
Anion gap: 16 — ABNORMAL HIGH (ref 5–15)
BUN: 21 mg/dL (ref 6–23)
CALCIUM: 9.9 mg/dL (ref 8.4–10.5)
CO2: 23 mEq/L (ref 19–32)
Chloride: 104 mEq/L (ref 96–112)
Creatinine, Ser: 0.79 mg/dL (ref 0.50–1.10)
GFR calc Af Amer: >90 mL/min (ref >90)
GLUCOSE: 88 mg/dL (ref 70–99)
Potassium: 3.9 mEq/L (ref 3.7–5.3)
Sodium: 143 mEq/L (ref 137–147)

## 2014-06-20 ENCOUNTER — Encounter (HOSPITAL_BASED_OUTPATIENT_CLINIC_OR_DEPARTMENT_OTHER): Payer: BC Managed Care – PPO | Admitting: Anesthesiology

## 2014-06-20 ENCOUNTER — Encounter (HOSPITAL_BASED_OUTPATIENT_CLINIC_OR_DEPARTMENT_OTHER)
Admission: RE | Disposition: A | Discharge status: Home / Self Care | Payer: Self-pay | Source: Ambulatory Visit | Attending: Orthopedic Surgery

## 2014-06-20 ENCOUNTER — Ambulatory Visit (HOSPITAL_BASED_OUTPATIENT_CLINIC_OR_DEPARTMENT_OTHER)
Admission: RE | Admit: 2014-06-20 | Discharge: 2014-06-20 | Disposition: A | Discharge status: Home / Self Care | Payer: BC Managed Care – PPO | Source: Ambulatory Visit | Attending: Orthopedic Surgery | Admitting: Orthopedic Surgery

## 2014-06-20 ENCOUNTER — Encounter (HOSPITAL_BASED_OUTPATIENT_CLINIC_OR_DEPARTMENT_OTHER): Payer: Self-pay | Admitting: Anesthesiology

## 2014-06-20 ENCOUNTER — Ambulatory Visit (HOSPITAL_BASED_OUTPATIENT_CLINIC_OR_DEPARTMENT_OTHER): Payer: BC Managed Care – PPO | Admitting: Anesthesiology

## 2014-06-20 DIAGNOSIS — E039 Hypothyroidism, unspecified: Secondary | ICD-10-CM | POA: Insufficient documentation

## 2014-06-20 DIAGNOSIS — E663 Overweight: Secondary | ICD-10-CM | POA: Insufficient documentation

## 2014-06-20 DIAGNOSIS — G5600 Carpal tunnel syndrome, unspecified upper limb: Secondary | ICD-10-CM | POA: Insufficient documentation

## 2014-06-20 DIAGNOSIS — M47812 Spondylosis without myelopathy or radiculopathy, cervical region: Secondary | ICD-10-CM | POA: Insufficient documentation

## 2014-06-20 DIAGNOSIS — K219 Gastro-esophageal reflux disease without esophagitis: Secondary | ICD-10-CM | POA: Insufficient documentation

## 2014-06-20 DIAGNOSIS — G4733 Obstructive sleep apnea (adult) (pediatric): Secondary | ICD-10-CM | POA: Insufficient documentation

## 2014-06-20 DIAGNOSIS — G2581 Restless legs syndrome: Secondary | ICD-10-CM | POA: Insufficient documentation

## 2014-06-20 DIAGNOSIS — I10 Essential (primary) hypertension: Secondary | ICD-10-CM | POA: Insufficient documentation

## 2014-06-20 DIAGNOSIS — M19049 Primary osteoarthritis, unspecified hand: Secondary | ICD-10-CM | POA: Insufficient documentation

## 2014-06-20 DIAGNOSIS — M1289 Other specific arthropathies, not elsewhere classified, multiple sites: Secondary | ICD-10-CM | POA: Insufficient documentation

## 2014-06-20 DIAGNOSIS — Z86718 Personal history of other venous thrombosis and embolism: Secondary | ICD-10-CM | POA: Insufficient documentation

## 2014-06-20 DIAGNOSIS — G43909 Migraine, unspecified, not intractable, without status migrainosus: Secondary | ICD-10-CM | POA: Insufficient documentation

## 2014-06-20 DIAGNOSIS — Z6835 Body mass index (BMI) 35.0-35.9, adult: Secondary | ICD-10-CM | POA: Insufficient documentation

## 2014-06-20 DIAGNOSIS — F411 Generalized anxiety disorder: Secondary | ICD-10-CM | POA: Insufficient documentation

## 2014-06-20 HISTORY — DX: Other specified postprocedural states: R11.2

## 2014-06-20 HISTORY — DX: Other specified postprocedural states: Z98.890

## 2014-06-20 HISTORY — PX: CARPAL TUNNEL RELEASE: SHX101

## 2014-06-20 HISTORY — PX: DISTAL INTERPHALANGEAL JOINT FUSION: SHX6428

## 2014-06-20 HISTORY — DX: Unspecified osteoarthritis, unspecified site: M19.90

## 2014-06-20 HISTORY — DX: Gastro-esophageal reflux disease without esophagitis: K21.9

## 2014-06-20 HISTORY — DX: Disease of blood and blood-forming organs, unspecified: D75.9

## 2014-06-20 LAB — POCT HEMOGLOBIN-HEMACUE: Hemoglobin: 12.7 g/dL (ref 12.0–15.0)

## 2014-06-20 SURGERY — DISTAL INTERPHALANGEAL JOINT FUSION
Anesthesia: General | Site: Wrist | Laterality: Left

## 2014-06-20 MED ORDER — DIPHENHYDRAMINE HCL 50 MG/ML IJ SOLN
INTRAMUSCULAR | Status: DC | PRN
  Administered 2014-06-20: 12.5 mg via INTRAVENOUS

## 2014-06-20 MED ORDER — FENTANYL CITRATE 0.05 MG/ML IJ SOLN
INTRAMUSCULAR | Status: AC
  Filled 2014-06-20: qty 2

## 2014-06-20 MED ORDER — SCOPOLAMINE 1 MG/3DAYS TD PT72
1.0000 | MEDICATED_PATCH | Freq: Once | TRANSDERMAL | Status: DC
  Administered 2014-06-20: 1.5 mg via TRANSDERMAL

## 2014-06-20 MED ORDER — ONDANSETRON HCL 4 MG/2ML IJ SOLN
INTRAMUSCULAR | Status: DC | PRN
  Administered 2014-06-20: 4 mg via INTRAVENOUS

## 2014-06-20 MED ORDER — OXYCODONE HCL 5 MG PO TABS: 5.0000 mg | ORAL_TABLET | Freq: Once | ORAL | Status: DC | PRN

## 2014-06-20 MED ORDER — CEFAZOLIN SODIUM-DEXTROSE 2-3 GM-% IV SOLR
2.0000 g | INTRAVENOUS | Status: DC
  Administered 2014-06-20: 2 g via INTRAVENOUS

## 2014-06-20 MED ORDER — FENTANYL CITRATE 0.05 MG/ML IJ SOLN: 25.0000 ug | INTRAMUSCULAR | Status: DC | PRN

## 2014-06-20 MED ORDER — MIDAZOLAM HCL 2 MG/2ML IJ SOLN
INTRAMUSCULAR | Status: AC
  Filled 2014-06-20: qty 2

## 2014-06-20 MED ORDER — DIPHENHYDRAMINE HCL 50 MG/ML IJ SOLN: 12.5000 mg | Freq: Once | INTRAMUSCULAR | Status: DC

## 2014-06-20 MED ORDER — DEXAMETHASONE SODIUM PHOSPHATE 4 MG/ML IJ SOLN
INTRAMUSCULAR | Status: DC | PRN
  Administered 2014-06-20: 10 mg via INTRAVENOUS

## 2014-06-20 MED ORDER — LACTATED RINGERS IV SOLN
INTRAVENOUS | Status: DC
  Administered 2014-06-20 (×2): via INTRAVENOUS

## 2014-06-20 MED ORDER — MIDAZOLAM HCL 2 MG/2ML IJ SOLN
1.0000 mg | INTRAMUSCULAR | Status: DC | PRN
  Administered 2014-06-20: 1 mg via INTRAVENOUS

## 2014-06-20 MED ORDER — CEFAZOLIN SODIUM-DEXTROSE 2-3 GM-% IV SOLR
INTRAVENOUS | Status: AC
  Filled 2014-06-20: qty 50

## 2014-06-20 MED ORDER — CHLORHEXIDINE GLUCONATE 4 % EX LIQD: 60.0000 mL | Freq: Once | CUTANEOUS | Status: DC

## 2014-06-20 MED ORDER — OXYCODONE HCL 5 MG/5ML PO SOLN: 5.0000 mg | Freq: Once | ORAL | Status: DC | PRN

## 2014-06-20 MED ORDER — FENTANYL CITRATE 0.05 MG/ML IJ SOLN
50.0000 ug | INTRAMUSCULAR | Status: DC | PRN
  Administered 2014-06-20: 50 ug via INTRAVENOUS

## 2014-06-20 MED ORDER — LIDOCAINE-EPINEPHRINE (PF) 1.5 %-1:200000 IJ SOLN
INTRAMUSCULAR | Status: DC | PRN
  Administered 2014-06-20: 20 mL via PERINEURAL

## 2014-06-20 MED ORDER — LIDOCAINE HCL (CARDIAC) 20 MG/ML IV SOLN
INTRAVENOUS | Status: DC | PRN
  Administered 2014-06-20: 50 mg via INTRAVENOUS

## 2014-06-20 MED ORDER — EPHEDRINE SULFATE 50 MG/ML IJ SOLN
INTRAMUSCULAR | Status: DC | PRN
  Administered 2014-06-20: 15 mg via INTRAVENOUS

## 2014-06-20 MED ORDER — PROPOFOL 10 MG/ML IV BOLUS
INTRAVENOUS | Status: DC | PRN
  Administered 2014-06-20: 200 mg via INTRAVENOUS

## 2014-06-20 MED ORDER — SCOPOLAMINE 1 MG/3DAYS TD PT72
MEDICATED_PATCH | TRANSDERMAL | Status: AC
  Filled 2014-06-20: qty 1

## 2014-06-20 MED ORDER — FENTANYL CITRATE 0.05 MG/ML IJ SOLN
INTRAMUSCULAR | Status: DC | PRN
  Administered 2014-06-20: 25 ug via INTRAVENOUS

## 2014-06-20 MED ORDER — CEFAZOLIN SODIUM-DEXTROSE 2-3 GM-% IV SOLR: 2.0000 g | INTRAVENOUS | Status: DC

## 2014-06-20 MED ORDER — MEPERIDINE HCL 25 MG/ML IJ SOLN: 6.2500 mg | INTRAMUSCULAR | Status: DC | PRN

## 2014-06-20 MED ORDER — BUPIVACAINE-EPINEPHRINE (PF) 0.5% -1:200000 IJ SOLN
INTRAMUSCULAR | Status: DC | PRN
  Administered 2014-06-20: 30 mL via PERINEURAL

## 2014-06-20 MED ORDER — PROPOFOL 10 MG/ML IV EMUL
INTRAVENOUS | Status: AC
  Filled 2014-06-20: qty 100

## 2014-06-20 MED ORDER — OXYCODONE-ACETAMINOPHEN 10-325 MG PO TABS: 1.0000 | ORAL_TABLET | ORAL | Status: DC | PRN

## 2014-06-20 MED ORDER — FENTANYL CITRATE 0.05 MG/ML IJ SOLN
INTRAMUSCULAR | Status: AC
  Filled 2014-06-20: qty 6

## 2014-06-20 MED ORDER — PROMETHAZINE HCL 25 MG/ML IJ SOLN: 6.2500 mg | INTRAMUSCULAR | Status: DC | PRN

## 2014-06-20 SURGICAL SUPPLY — 57 items
BLADE MINI RND TIP GREEN BEAV (BLADE) ×4 IMPLANT
BLADE SURG 15 STRL LF DISP TIS (BLADE) ×2 IMPLANT
BLADE SURG 15 STRL SS (BLADE) ×4
BNDG CMPR 9X4 STRL LF SNTH (GAUZE/BANDAGES/DRESSINGS) ×4
BNDG COHESIVE 1X5 TAN STRL LF (GAUZE/BANDAGES/DRESSINGS) ×3 IMPLANT
BNDG COHESIVE 3X5 TAN STRL LF (GAUZE/BANDAGES/DRESSINGS) ×4 IMPLANT
BNDG ESMARK 4X9 LF (GAUZE/BANDAGES/DRESSINGS) ×6 IMPLANT
BNDG GAUZE ELAST 4 BULKY (GAUZE/BANDAGES/DRESSINGS) ×4 IMPLANT
BUR FAST CUTTING MED (BURR) IMPLANT
CHLORAPREP W/TINT 26ML (MISCELLANEOUS) ×4 IMPLANT
CORDS BIPOLAR (ELECTRODE) ×4 IMPLANT
COVER MAYO STAND STRL (DRAPES) ×4 IMPLANT
COVER TABLE BACK 60X90 (DRAPES) ×4 IMPLANT
CUFF TOURNIQUET SINGLE 18IN (TOURNIQUET CUFF) ×4 IMPLANT
DRAPE EXTREMITY T 121X128X90 (DRAPE) ×4 IMPLANT
DRAPE OEC MINIVIEW 54X84 (DRAPES) ×4 IMPLANT
DRAPE SURG 17X23 STRL (DRAPES) ×4 IMPLANT
DRSG PAD ABDOMINAL 8X10 ST (GAUZE/BANDAGES/DRESSINGS) ×4 IMPLANT
GAUZE SPONGE 4X4 12PLY STRL (GAUZE/BANDAGES/DRESSINGS) ×4 IMPLANT
GAUZE XEROFORM 1X8 LF (GAUZE/BANDAGES/DRESSINGS) ×4 IMPLANT
GLOVE BIO SURGEON STRL SZ7.5 (GLOVE) ×2 IMPLANT
GLOVE BIOGEL PI IND STRL 7.0 (GLOVE) IMPLANT
GLOVE BIOGEL PI IND STRL 8 (GLOVE) ×1 IMPLANT
GLOVE BIOGEL PI IND STRL 8.5 (GLOVE) ×2 IMPLANT
GLOVE BIOGEL PI INDICATOR 7.0 (GLOVE) ×4
GLOVE BIOGEL PI INDICATOR 8 (GLOVE) ×2
GLOVE BIOGEL PI INDICATOR 8.5 (GLOVE) ×2
GLOVE ECLIPSE 6.5 STRL STRAW (GLOVE) ×3 IMPLANT
GLOVE SURG ORTHO 8.0 STRL STRW (GLOVE) ×4 IMPLANT
GOWN STRL REUS W/ TWL LRG LVL3 (GOWN DISPOSABLE) ×2 IMPLANT
GOWN STRL REUS W/TWL LRG LVL3 (GOWN DISPOSABLE) ×4
GOWN STRL REUS W/TWL XL LVL3 (GOWN DISPOSABLE) ×6 IMPLANT
NEEDLE 27GAX1X1/2 (NEEDLE) IMPLANT
NS IRRIG 1000ML POUR BTL (IV SOLUTION) ×4 IMPLANT
PACK BASIN DAY SURGERY FS (CUSTOM PROCEDURE TRAY) ×4 IMPLANT
PAD CAST 3X4 CTTN HI CHSV (CAST SUPPLIES) ×2 IMPLANT
PADDING CAST ABS 3INX4YD NS (CAST SUPPLIES)
PADDING CAST ABS 4INX4YD NS (CAST SUPPLIES) ×2
PADDING CAST ABS COTTON 3X4 (CAST SUPPLIES) IMPLANT
PADDING CAST ABS COTTON 4X4 ST (CAST SUPPLIES) ×2 IMPLANT
PADDING CAST COTTON 3X4 STRL (CAST SUPPLIES) ×4
SCREW LP 3.0X30MM (Screw) ×4 IMPLANT
SLEEVE SCD COMPRESS KNEE MED (MISCELLANEOUS) ×2 IMPLANT
SLEEVE SURGEON STRL (DRAPES) ×3 IMPLANT
SLING ARM LRG ADULT FOAM STRAP (SOFTGOODS) ×2 IMPLANT
SPLINT FINGER 5/8X3.25 (SOFTGOODS) ×2 IMPLANT
SPLINT FINGER FOAM 3 9119 05 (SOFTGOODS) ×8
SPLINT PLASTER CAST XFAST 3X15 (CAST SUPPLIES) ×10 IMPLANT
SPLINT PLASTER XTRA FASTSET 3X (CAST SUPPLIES)
STOCKINETTE 4X48 STRL (DRAPES) ×4 IMPLANT
SUT VICRYL 4-0 PS2 18IN ABS (SUTURE) ×4 IMPLANT
SUT VICRYL RAPIDE 4/0 PS 2 (SUTURE) ×7 IMPLANT
SYR BULB 3OZ (MISCELLANEOUS) ×4 IMPLANT
SYR CONTROL 10ML LL (SYRINGE) IMPLANT
TOWEL OR 17X24 6PK STRL BLUE (TOWEL DISPOSABLE) ×4 IMPLANT
TOWEL OR NON WOVEN STRL DISP B (DISPOSABLE) ×2 IMPLANT
UNDERPAD 30X30 INCONTINENT (UNDERPADS AND DIAPERS) ×4 IMPLANT

## 2014-06-20 NOTE — Transfer of Care (Signed)
Immediate Anesthesia Transfer of Care Note  Patient: Samantha Eaton  Procedure(s) Performed: Procedure(s): DISTAL INTERPHALANGEAL JOINT FUSION LEFT INDEX, MIDDLE FINGERS (Left) LEFT CARPAL TUNNEL RELEASE (Left)  Patient Location: PACU  Anesthesia Type:General and Regional  Level of Consciousness: sedated  Airway & Oxygen Therapy: Patient Spontanous Breathing and Patient connected to face mask oxygen  Post-op Assessment: Report given to PACU RN and Post -op Vital signs reviewed and stable  Post vital signs: Reviewed and stable  Complications: No apparent anesthesia complications

## 2014-06-20 NOTE — Anesthesia Preprocedure Evaluation (Addendum)
Anesthesia Evaluation  Patient identified by MRN, date of birth, ID band Patient awake    Reviewed: Allergy & Precautions, H&P , NPO status , Patient's Chart, lab work & pertinent test results  History of Anesthesia Complications (+) PONV and history of anesthetic complications  Airway Mallampati: I TM Distance: >3 FB Neck ROM: Full    Dental  (+) Edentulous Upper, Dental Advisory Given   Pulmonary sleep apnea and Continuous Positive Airway Pressure Ventilation ,  sarcoid breath sounds clear to auscultation        Cardiovascular hypertension, Pt. on medications - anginaDVT Rhythm:Regular Rate:Normal     Neuro/Psych negative neurological ROS     GI/Hepatic Neg liver ROS, GERD-  Medicated and Controlled,  Endo/Other  Hypothyroidism Morbid obesity  Renal/GU negative Renal ROS     Musculoskeletal   Abdominal (+) + obese,   Peds  Hematology negative hematology ROS (+)   Anesthesia Other Findings   Reproductive/Obstetrics                          Anesthesia Physical Anesthesia Plan  ASA: III  Anesthesia Plan: General   Post-op Pain Management:    Induction: Intravenous  Airway Management Planned: LMA  Additional Equipment:   Intra-op Plan:   Post-operative Plan:   Informed Consent: I have reviewed the patients History and Physical, chart, labs and discussed the procedure including the risks, benefits and alternatives for the proposed anesthesia with the patient or authorized representative who has indicated his/her understanding and acceptance.   Dental advisory given  Plan Discussed with: CRNA and Surgeon  Anesthesia Plan Comments: (Plan routine monitors, GA-LMA OK, axillary block for post op analgesia)        Anesthesia Quick Evaluation

## 2014-06-20 NOTE — Op Note (Signed)
Dictation Number [id number]

## 2014-06-20 NOTE — Anesthesia Procedure Notes (Addendum)
Anesthesia Regional Block:  Axillary brachial plexus block  Pre-Anesthetic Checklist: ,, timeout performed, Correct Patient, Correct Site, Correct Laterality, Correct Procedure, Correct Position, site marked, Risks and benefits discussed,  Surgical consent,  Pre-op evaluation,  At surgeon's request and post-op pain management  Laterality: Left and Upper  Prep: chloraprep       Needles:  Injection technique: Single-shot  Needle Type: Other   (short "B" bevel needle)    Needle Gauge: 22 and 22 G    Additional Needles:  Procedures: paresthesia technique Axillary brachial plexus block  Nerve Stimulator or Paresthesia:  Response: transient median nerve paresthesia,  Response: transient ulnar nerve paresthesia,   Additional Responses:   Narrative:  Start time: 06/20/2014 7:59 AM End time: 06/20/2014 8:06 AM Injection made incrementally with aspirations every 5 mL.  Performed by: Personally  Anesthesiologist: Jenita Seashore, MD  Additional Notes: Pt identified in Holding room.  Monitors applied. Working IV access confirmed. Sterile prep L axilla.  #22ga short "B" bevel to transient median and then ulnar nerve paresthesiae.  Total 20cc 1.5% lidocaine with 1:200k epi and 30cc 0.5% Bupivacaine with 1:200k epi injected incrementally after negative test dose, distributed around each paresthesia.  Patient asymptomatic, VSS, no heme aspirated, tolerated well.  Jenita Seashore ,MD   Procedure Name: LMA Insertion Date/Time: 06/20/2014 8:51 AM Performed by: Melynda Ripple D Pre-anesthesia Checklist: Patient identified, Emergency Drugs available, Suction available and Patient being monitored Patient Re-evaluated:Patient Re-evaluated prior to inductionOxygen Delivery Method: Circle System Utilized Preoxygenation: Pre-oxygenation with 100% oxygen Intubation Type: IV induction Ventilation: Mask ventilation without difficulty LMA: LMA inserted LMA Size: 4.0 Number of attempts: 1 Airway Equipment and  Method: bite block Placement Confirmation: positive ETCO2 Tube secured with: Tape Dental Injury: Teeth and Oropharynx as per pre-operative assessment

## 2014-06-20 NOTE — Progress Notes (Signed)
Assisted Dr. Glennon Mac with left, axillary block. Side rails up, monitors on throughout procedure. See vital signs in flow sheet. Tolerated Procedure well.

## 2014-06-20 NOTE — Anesthesia Postprocedure Evaluation (Signed)
  Anesthesia Post-op Note  Patient: [name]  Procedure(s) Performed: Procedure(s): DISTAL INTERPHALANGEAL JOINT FUSION LEFT INDEX, MIDDLE FINGERS (Left) LEFT CARPAL TUNNEL RELEASE (Left)  Patient Location: PACU  Anesthesia Type:GA combined with regional for post-op pain  Level of Consciousness: awake, alert , oriented and patient cooperative  Airway and Oxygen Therapy: Patient Spontanous Breathing  Post-op Pain: none  Post-op Assessment: Post-op Vital signs reviewed, Patient's Cardiovascular Status Stable, Respiratory Function Stable, Patent Airway, No signs of Nausea or vomiting and Pain level controlled  Post-op Vital Signs: Reviewed and stable  Last Vitals:  Filed Vitals:   06/20/14 1137  BP: 145/85  Pulse: 104  Temp: 36.7 C  Resp: 20    Complications: No apparent anesthesia complications
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
## 2014-06-20 NOTE — Discharge Instructions (Addendum)
[facility] Instructions °Hand Surgery ° °Wound Care: °Keep your hand elevated above the level of your heart.  Do not allow it to dangle by your side.  Keep the dressing dry and do not remove it unless your doctor advises you to do so.  He will usually change it at the time of your post-op visit.  Moving your fingers is advised to stimulate circulation but will depend on the site of your surgery.  If you have a splint applied, your doctor will advise you regarding movement. ° °Activity: °Do not drive or operate machinery today.  Rest today and then you may return to your normal activity and work as indicated by your physician. ° °Diet:  °Drink liquids today or eat a light diet.  You may resume a regular diet tomorrow.   ° °General expectations: °Pain for two to three days. °Fingers may become slightly swollen. ° °Call your doctor if any of the following occur: °Severe pain not relieved by pain medication. °Elevated temperature. °Dressing soaked with blood. °Inability to move fingers. °White or bluish color to fingers. ° ° °Regional Anesthesia Blocks ° °1. Numbness or the inability to move the "blocked" extremity may last from 3-48 hours after placement. The length of time depends on the medication injected and your individual response to the medication. If the numbness is not going away after 48 hours, call your surgeon. ° °2. The extremity that is blocked will need to be protected until the numbness is gone and the  Strength has returned. Because you cannot feel it, you will need to take extra care to avoid injury. Because it may be weak, you may have difficulty moving it or using it. You may not know what position it is in without looking at it while the block is in effect. ° °3. For blocks in the legs and feet, returning to weight bearing and walking needs to be done carefully. You will need to wait until the numbness is entirely gone and the strength has returned. You should be able to move your leg and foot  normally before you try and bear weight or walk. You will need someone to be with you when you first try to ensure you do not fall and possibly risk injury. ° °4. Bruising and tenderness at the needle site are common side effects and will resolve in a few days. ° °5. Persistent numbness or new problems with movement should be communicated to the surgeon or the  Surgery Center ([phone])/ [facility] ([phone]). ° ° °Post Anesthesia Home Care Instructions ° °Activity: °Get plenty of rest for the remainder of the day. A responsible adult should stay with you for 24 hours following the procedure.  °For the next 24 hours, DO NOT: °-Drive a car °-Operate machinery °-Drink alcoholic beverages °-Take any medication unless instructed by your physician °-Make any legal decisions or sign important papers. ° °Meals: °Start with liquid foods such as gelatin or soup. Progress to regular foods as tolerated. Avoid greasy, spicy, heavy foods. If nausea and/or vomiting occur, drink only clear liquids until the nausea and/or vomiting subsides. Call your physician if vomiting continues. ° °Special Instructions/Symptoms: °Your throat may feel dry or sore from the anesthesia or the breathing tube placed in your throat during surgery. If this causes discomfort, gargle with warm salt water. The discomfort should disappear within 24 hours. ° °

## 2014-06-20 NOTE — Brief Op Note (Signed)
06/20/2014  10:22 AM  PATIENT:  [name]  53 y.o. female  PRE-OPERATIVE DIAGNOSIS:  Degenerative Arthritis Distal Interphalangeal Joints, Left Index, Middle Fingers, Left Carpal Tunnel Syndrome  POST-OPERATIVE DIAGNOSIS:  Degenerative Arthritis Distal Interphalangeal Joints, Left Index, Middle Fingers, Left Carpal Tunnel Syndrome  PROCEDURE:  Procedure(s): DISTAL INTERPHALANGEAL JOINT FUSION LEFT INDEX, MIDDLE FINGERS (Left) LEFT CARPAL TUNNEL RELEASE (Left)  SURGEON:  Surgeon(s) and Role:    * Wynonia Sours, MD - Primary    * Tennis Must, MD - Assisting  PHYSICIAN ASSISTANT:   ASSISTANTS: K Flara Storti,MD   ANESTHESIA:   regional and general  EBL:  Total I/O In: 1000 [I.V.:1000] Out: -   BLOOD ADMINISTERED:none  DRAINS: none   LOCAL MEDICATIONS USED:  NONE  SPECIMEN:  No Specimen  DISPOSITION OF SPECIMEN:  N/A  COUNTS:  YES  TOURNIQUET:   Total Tourniquet Time Documented: Upper Arm (Left) - 66 minutes Total: Upper Arm (Left) - 66 minutes   DICTATION: .Other Dictation: Dictation Number [id number]  PLAN OF CARE: Discharge to home after PACU  PATIENT DISPOSITION:  PACU - hemodynamically stable.

## 2014-06-21 NOTE — Op Note (Signed)
Samantha Eaton, Samantha Eaton             ACCOUNT NO.:  [id number]  MEDICAL RECORD NO.:  [id number]  LOCATION:                                 FACILITY:  PHYSICIAN:  Daryll Brod, M.D.            DATE OF BIRTH:  DATE OF PROCEDURE:  06/20/2014 DATE OF DISCHARGE:                              OPERATIVE REPORT   PREOPERATIVE DIAGNOSIS:  Degenerative arthritis, distal interphalangeal joints left index, left middle fingers with carpal tunnel syndrome, left hand.  POSTOPERATIVE DIAGNOSIS:  Degenerative arthritis, distal interphalangeal joints left index, left middle fingers with carpal tunnel syndrome, left hand.  OPERATION:  Decompression of left median nerve with fusion of distal interphalangeal joints, left index, left middle fingers with __________ compression screws.  SURGEON:  Daryll Brod, M.D.  ASSISTANT:  Leanora Cover, MD  ANESTHESIA:  Supraclavicular block, general.  ANESTHESIOLOGIST:  Dr. Glennon Mac.  HISTORY:  The patient is a 53 year old female with a complaint of marked pain of distal interphalangeal joints for left index, left middle fingers.  She has elected to proceed to have these fused.  Nerve conductions reveal a carpal tunnel syndrome with positive nerve conductions.  She has elected to undergo release of the carpal tunnel at the same time.  Pre, peri, and postoperative course have been discussed along with risks and complications.  She is aware that there is no guarantee with the surgery, possibility of infection; recurrence of injury to arteries, nerves, tendons; incomplete release of symptoms; and dystrophy.  In the preoperative area, the patient is seen, the extremity marked by both patient and surgeon.  Antibiotic given.  PROCEDURE IN DETAIL:  The patient was brought to the operating room, where a supraclavicular block having been carried out in the preoperative area was supplemented with general anesthetic under the direction of Dr. Glennon Mac.  She was prepped using  ChloraPrep, supine position with the left arm free.  A 3-minute dry time was allowed.  Time- out taken, confirming the patient and procedure.  The limb was exsanguinated with an Esmarch bandage.  Tourniquet placed high and the arm was inflated to 250 mmHg.  A longitudinal incision was made in the left palm, carried down through subcutaneous tissue.  Bleeders were electrocauterized.  Palmar fascia was split.  Superficial palmar arch identified.  The flexor tendon to the ring and little finger identified to the ulnar side of the median nerve.  The carpal retinaculum was incised with sharp dissection.  Right angle and Sewall retractor were placed between skin and forearm fascia.  The fascia was released for approximately 1.5 cm proximal to the wrist crease under direct vision. Canal was explored.  Air compression to the nerve was apparent.  Motor branch entered into the muscle centrally was found to be entirely intact.  The wound was copiously irrigated with saline.  The skin then closed with interrupted 4-0 Vicryl Rapide sutures.  Hemostasis was done with bipolar electrocautery.  A separate incision was then made over the distal interphalangeal joint of the middle finger, carried down through subcutaneous tissue.  Bleeders again electrocauterized with bipolar. The extensor tendon was cut just proximal to the DIP joint.  The collateral ligaments were  incised and the DIP joint was shotgun opened, no cartilage was present on either the middle phalanx or distal phalanx. A rongeur was used to make a __________ trough for fusion.  This was done down to cancellous bone with removal of the condyles of the middle phalanx.  The guide pin for an __________ compression screw headless in nature was then passed into the distal phalanx.  X-rays confirmed positioning in the AP and lateral direction down the central aspect of the distal phalanx.  This was driven across the bone and up through the distal  phalanx skin.  This was then positioned on the middle phalanx, compressed, and driven across into the middle phalanx.  X-rays in the AP and lateral direction revealed that it went centrally down the middle phalanx.  This was measured to be approximately 35 mm.  A 25 mm screw was then inserted.  This was found not to cross the joint sufficiently. This was removed and a 30 mm narrow screw was then inserted.  This firmly compressed the distal interphalangeal joint in both AP and lateral directions and full extension.  A similar incision was then made over the distal interphalangeal joint of the index finger.  Again, carried down through subcutaneous tissue, bleeders were electrocauterized with bipolar.  The extensor tendon split proximal to the joint.  Collateral ligaments opened.  The joint shotgun opened and the eburnated bone was then removed with a rongeur including the condyles of the middle phalanx.  The __________ trough were then shaped down to cancellous bone.  A guide pin was then inserted under fluoroscopic control centrally down the distal phalanx and then passed into the middle phalanx centrally down the bone.  Another 30 mm screw was then inserted, each was inserted after making a small incision through the skin at the tip of each digit.  This firmly compressed the distal interphalangeal joint on AP and lateral x-rays with the screw entirely within the bone.  Excellent fixation was afforded.  The wounds were irrigated.  The extensor tendon repaired with figure-of-eight 4-0 Vicryl sutures and the skin with interrupted 4-0 Vicryl Rapide sutures including the incisions made for the distal insertion.  Nonadherent gauze was placed.  Sterile compressive dressing was applied for the carpal tunnel.  A sterile compressive dressing and splint applied to each of the fingers.  On deflation of the tourniquet, remaining fingers pinked.  She was taken to the recovery room for observation in  a satisfactory condition.  She will be discharged home to return to the [place] in 1 week on Percocet.          ______________________________ Daryll Brod, M.D.     GK/MEDQ  D:  06/20/2014  T:  06/20/2014  Job:  [id number]

## 2014-06-23 ENCOUNTER — Encounter (HOSPITAL_BASED_OUTPATIENT_CLINIC_OR_DEPARTMENT_OTHER): Payer: Self-pay | Admitting: Orthopedic Surgery

## 2014-07-21 ENCOUNTER — Other Ambulatory Visit: Payer: Self-pay | Admitting: Adult Health

## 2014-08-08 ENCOUNTER — Telehealth: Payer: Self-pay | Admitting: Adult Health

## 2014-08-08 NOTE — Telephone Encounter (Signed)
Try prilosec OTC ass insurance won't pay for nexium

## 2014-09-18 ENCOUNTER — Encounter (HOSPITAL_BASED_OUTPATIENT_CLINIC_OR_DEPARTMENT_OTHER): Payer: Self-pay | Admitting: Orthopedic Surgery

## 2014-09-20 ENCOUNTER — Other Ambulatory Visit: Payer: BC Managed Care – PPO | Admitting: Adult Health

## 2014-09-27 ENCOUNTER — Telehealth: Payer: Self-pay | Admitting: *Deleted

## 2014-09-27 ENCOUNTER — Encounter: Payer: Self-pay | Admitting: Adult Health

## 2014-09-27 ENCOUNTER — Ambulatory Visit (INDEPENDENT_AMBULATORY_CARE_PROVIDER_SITE_OTHER): Payer: BC Managed Care – PPO | Admitting: Adult Health

## 2014-09-27 VITALS — BP 138/88 | HR 74 | Ht 64.0 in | Wt 210.0 lb

## 2014-09-27 DIAGNOSIS — E038 Other specified hypothyroidism: Secondary | ICD-10-CM

## 2014-09-27 DIAGNOSIS — F419 Anxiety disorder, unspecified: Secondary | ICD-10-CM

## 2014-09-27 DIAGNOSIS — E7800 Pure hypercholesterolemia, unspecified: Secondary | ICD-10-CM

## 2014-09-27 DIAGNOSIS — Z1212 Encounter for screening for malignant neoplasm of rectum: Secondary | ICD-10-CM

## 2014-09-27 DIAGNOSIS — I10 Essential (primary) hypertension: Secondary | ICD-10-CM

## 2014-09-27 DIAGNOSIS — R319 Hematuria, unspecified: Secondary | ICD-10-CM

## 2014-09-27 DIAGNOSIS — Z01419 Encounter for gynecological examination (general) (routine) without abnormal findings: Secondary | ICD-10-CM

## 2014-09-27 DIAGNOSIS — G2581 Restless legs syndrome: Secondary | ICD-10-CM

## 2014-09-27 DIAGNOSIS — E669 Obesity, unspecified: Secondary | ICD-10-CM

## 2014-09-27 DIAGNOSIS — R300 Dysuria: Secondary | ICD-10-CM

## 2014-09-27 HISTORY — DX: Dysuria: R30.0

## 2014-09-27 LAB — CBC
HCT: 36.4 % (ref 36.0–46.0)
Hemoglobin: 12.9 g/dL (ref 12.0–15.0)
MCH: 30.5 pg (ref 26.0–34.0)
MCHC: 35.4 g/dL (ref 30.0–36.0)
MCV: 86.1 fL (ref 78.0–100.0)
PLATELETS: 595 10*3/uL — AB (ref 150–400)
RBC: 4.23 MIL/uL (ref 3.87–5.11)
RDW: 13.9 % (ref 11.5–15.5)
WBC: 11.4 10*3/uL — ABNORMAL HIGH (ref 4.0–10.5)

## 2014-09-27 LAB — COMPREHENSIVE METABOLIC PANEL
ALT: 21 U/L (ref 0–35)
AST: 22 U/L (ref 0–37)
Albumin: 4.3 g/dL (ref 3.5–5.2)
Alkaline Phosphatase: 67 U/L (ref 39–117)
BILIRUBIN TOTAL: 0.4 mg/dL (ref 0.2–1.2)
BUN: 19 mg/dL (ref 6–23)
CO2: 23 mEq/L (ref 19–32)
Calcium: 10.3 mg/dL (ref 8.4–10.5)
Chloride: 101 mEq/L (ref 96–112)
Creat: 0.82 mg/dL (ref 0.50–1.10)
Glucose, Bld: 84 mg/dL (ref 70–99)
Potassium: 4.4 mEq/L (ref 3.5–5.3)
SODIUM: 140 meq/L (ref 135–145)
TOTAL PROTEIN: 7.3 g/dL (ref 6.0–8.3)

## 2014-09-27 LAB — POCT URINALYSIS DIPSTICK
GLUCOSE UA: NEGATIVE
Leukocytes, UA: NEGATIVE
Nitrite, UA: NEGATIVE
Protein, UA: NEGATIVE

## 2014-09-27 LAB — LIPID PANEL
Cholesterol: 196 mg/dL (ref 0–200)
HDL: 52 mg/dL (ref >39)
LDL Cholesterol: 113 mg/dL — ABNORMAL HIGH (ref 0–99)
Total CHOL/HDL Ratio: 3.8 Ratio
Triglycerides: 155 mg/dL — ABNORMAL HIGH (ref <150)
VLDL: 31 mg/dL (ref 0–40)

## 2014-09-27 LAB — HEMOCCULT GUIAC POC 1CARD (OFFICE): Fecal Occult Blood, POC: NEGATIVE

## 2014-09-27 LAB — TSH: TSH: 1.433 u[IU]/mL (ref 0.350–4.500)

## 2014-09-27 MED ORDER — HYDROCHLOROTHIAZIDE 12.5 MG PO CAPS: ORAL_CAPSULE | ORAL | Status: DC

## 2014-09-27 MED ORDER — AMLODIPINE BESYLATE 5 MG PO TABS: 5.0000 mg | ORAL_TABLET | Freq: Every day | ORAL | Status: DC

## 2014-09-27 MED ORDER — SIMVASTATIN 20 MG PO TABS: 20.0000 mg | ORAL_TABLET | Freq: Every day | ORAL | Status: DC

## 2014-09-27 MED ORDER — PRAMIPEXOLE DIHYDROCHLORIDE 0.5 MG PO TABS: 0.5000 mg | ORAL_TABLET | Freq: Two times a day (BID) | ORAL | Status: DC

## 2014-09-27 MED ORDER — ESCITALOPRAM OXALATE 10 MG PO TABS: ORAL_TABLET | ORAL | Status: DC

## 2014-09-27 MED ORDER — VALACYCLOVIR HCL 1 G PO TABS: ORAL_TABLET | ORAL | Status: DC

## 2014-09-27 MED ORDER — MELOXICAM 7.5 MG PO TABS: 7.5000 mg | ORAL_TABLET | Freq: Every day | ORAL | Status: DC

## 2014-09-27 NOTE — Telephone Encounter (Signed)
Melissa from [facility] called. [facility] refilled Mobic 7.5 mg today. In the past, pt was on Mobic 15 mg. Pt was on Lexapro 10 mg 1 daily, now on half a tab daily. I spoke with pt and the correct meds are Mobic 7.5 mg and Lexapro 10 mg half a tab daily. Melissa from pharmacy aware. [place]

## 2014-09-27 NOTE — Patient Instructions (Addendum)
Check BP at home physical in 1 year Mammogram yearly TRY LUVENA for vaginal Sex astro glide

## 2014-09-27 NOTE — Progress Notes (Signed)
Patient ID: Samantha Eaton, female   DOB: [birth year], 53 y.o.   MRN: [id number] History of Present Illness: Samantha Eaton is a 53 year old white female in for gyn exam and complains of burning with urination and sex hurts at times.Some night sweats and body aches,moods good but this time of year is hard.   Current Medications, Allergies, Past Medical History, Past Surgical History, Family History and Social History were reviewed in [employer] record.     Review of Systems: Patient denies any headaches, blurred vision, shortness of breath, chest pain, abdominal pain, problems with bowel movements, she HPI for positives.    Physical Exam:BP 138/88 mmHg  Pulse 74  Ht 5\' 4"  (1.626 m)  Wt 210 lb (95.255 kg)  BMI 36.03 kg/m2urine 1+blood General:  Well developed, well nourished, no acute distress Skin:  Warm and dry Neck:  Midline trachea, normal thyroid Lungs; Clear to auscultation bilaterally Breast:  No dominant palpable mass, retraction, or nipple discharge Cardiovascular: Regular rate and rhythm Abdomen:  Soft, non tender, no hepatosplenomegaly Pelvic:  External genitalia is normal in appearance for age, no lesions  The vagina has fair moisture and is pale with loss of rugae. The cervix and uterus are absent.  No  adnexal masses or tenderness noted. Rectal: Good sphincter tone, no polyps, or hemorrhoids felt.  Hemoccult negative. Extremities:  No swelling or varicosities noted Psych:  No mood changes,alert and cooperative,seems happy   Impression: Well woman exam no pap Hypertension Elevated cholesterol Hypothyroid  Obesity RLS Anxiety  Hematuria    Plan: UA C&S sent Mammogram yearly Physical in 1 year Check CBC,CMP,TSH and lipids Will increase microzide to 25 mg daily,check BP at home and call with results in about 2-3 weeks Try luvena for vaginal moisture Try astroglide or olive oil with sex Refilled norvasc 5 mg 1 daily,lexapro 10 mg 1 daily,mobic  7.5 mg 1 daily,zocor 20 mg 1 daily,mirapex 0.5 mg 1 bid and valtrex 1 gm prn x 1 year Colonoscopy as per GI

## 2014-09-28 ENCOUNTER — Telehealth: Payer: Self-pay | Admitting: Adult Health

## 2014-09-28 LAB — URINALYSIS
Bilirubin Urine: NEGATIVE
Glucose, UA: NEGATIVE mg/dL
KETONES UR: NEGATIVE mg/dL
Nitrite: NEGATIVE
PH: 5.5 (ref 5.0–8.0)
Protein, ur: NEGATIVE mg/dL
Specific Gravity, Urine: 1.019 (ref 1.005–1.030)
Urobilinogen, UA: 0.2 mg/dL (ref 0.0–1.0)

## 2014-09-28 NOTE — Telephone Encounter (Signed)
Left message with husband to call in am

## 2014-09-29 ENCOUNTER — Telehealth: Payer: Self-pay | Admitting: Adult Health

## 2014-09-29 LAB — URINE CULTURE: Colony Count: 9000

## 2014-09-29 NOTE — Telephone Encounter (Signed)
Pt aware of labs  

## 2015-07-24 ENCOUNTER — Other Ambulatory Visit: Payer: Self-pay

## 2015-07-24 DIAGNOSIS — Z1231 Encounter for screening mammogram for malignant neoplasm of breast: Secondary | ICD-10-CM

## 2015-07-30 ENCOUNTER — Other Ambulatory Visit: Payer: Self-pay | Admitting: Adult Health

## 2015-08-09 ENCOUNTER — Ambulatory Visit
Admission: RE | Admit: 2015-08-09 | Discharge: 2015-08-09 | Disposition: A | Discharge status: Home / Self Care | Payer: PRIVATE HEALTH INSURANCE | Source: Ambulatory Visit

## 2015-08-09 DIAGNOSIS — Z1231 Encounter for screening mammogram for malignant neoplasm of breast: Secondary | ICD-10-CM

## 2015-10-02 ENCOUNTER — Other Ambulatory Visit: Payer: BC Managed Care – PPO | Admitting: Adult Health

## 2015-10-04 ENCOUNTER — Ambulatory Visit: Payer: Self-pay | Admitting: Family

## 2015-10-04 ENCOUNTER — Encounter: Payer: Self-pay | Admitting: Physician Assistant

## 2015-10-04 VITALS — BP 120/80 | HR 83 | Temp 98.4°F

## 2015-10-04 DIAGNOSIS — J019 Acute sinusitis, unspecified: Secondary | ICD-10-CM

## 2015-10-04 MED ORDER — AMOXICILLIN 875 MG PO TABS: 875.0000 mg | ORAL_TABLET | Freq: Two times a day (BID) | ORAL | Status: DC

## 2015-10-04 NOTE — Progress Notes (Signed)
S/ for a wk has had congestion and sxs are worse x 2 days with headaches , ST fever and malaise O/ acutely ill VSS ENT nasal mucosa red and swollen , + frontal , max tenderness neck supple heart rsr lungs clear  A/ rhinosinusitis P/ amoxicillan 875 bid.  Nasal saline products bid and prn Supportive measures

## 2015-10-05 ENCOUNTER — Ambulatory Visit: Payer: PRIVATE HEALTH INSURANCE | Admitting: Physician Assistant

## 2015-10-12 ENCOUNTER — Other Ambulatory Visit: Payer: Self-pay | Admitting: Adult Health

## 2015-10-24 ENCOUNTER — Encounter: Payer: Self-pay | Admitting: Adult Health

## 2015-10-24 ENCOUNTER — Ambulatory Visit (INDEPENDENT_AMBULATORY_CARE_PROVIDER_SITE_OTHER): Payer: PRIVATE HEALTH INSURANCE | Admitting: Adult Health

## 2015-10-24 VITALS — BP 122/80 | HR 82 | Ht 64.0 in | Wt 214.0 lb

## 2015-10-24 DIAGNOSIS — Z1211 Encounter for screening for malignant neoplasm of colon: Secondary | ICD-10-CM

## 2015-10-24 DIAGNOSIS — Z01419 Encounter for gynecological examination (general) (routine) without abnormal findings: Secondary | ICD-10-CM | POA: Diagnosis not present

## 2015-10-24 DIAGNOSIS — F419 Anxiety disorder, unspecified: Secondary | ICD-10-CM

## 2015-10-24 DIAGNOSIS — I10 Essential (primary) hypertension: Secondary | ICD-10-CM

## 2015-10-24 DIAGNOSIS — R52 Pain, unspecified: Secondary | ICD-10-CM

## 2015-10-24 DIAGNOSIS — G2581 Restless legs syndrome: Secondary | ICD-10-CM

## 2015-10-24 DIAGNOSIS — E038 Other specified hypothyroidism: Secondary | ICD-10-CM

## 2015-10-24 DIAGNOSIS — E7800 Pure hypercholesterolemia, unspecified: Secondary | ICD-10-CM

## 2015-10-24 DIAGNOSIS — E669 Obesity, unspecified: Secondary | ICD-10-CM

## 2015-10-24 LAB — HEMOCCULT GUIAC POC 1CARD (OFFICE): FECAL OCCULT BLD: NEGATIVE

## 2015-10-24 MED ORDER — MELOXICAM 7.5 MG PO TABS: ORAL_TABLET | ORAL | Status: DC

## 2015-10-24 MED ORDER — AMLODIPINE BESYLATE 5 MG PO TABS: 5.0000 mg | ORAL_TABLET | Freq: Every day | ORAL | Status: DC

## 2015-10-24 MED ORDER — PRAMIPEXOLE DIHYDROCHLORIDE 0.5 MG PO TABS: 0.5000 mg | ORAL_TABLET | Freq: Two times a day (BID) | ORAL | Status: DC

## 2015-10-24 MED ORDER — GABAPENTIN 300 MG PO CAPS: 300.0000 mg | ORAL_CAPSULE | Freq: Three times a day (TID) | ORAL | Status: DC

## 2015-10-24 MED ORDER — ESCITALOPRAM OXALATE 10 MG PO TABS: ORAL_TABLET | ORAL | Status: DC

## 2015-10-24 MED ORDER — SIMVASTATIN 20 MG PO TABS: 20.0000 mg | ORAL_TABLET | Freq: Every day | ORAL | Status: DC

## 2015-10-24 MED ORDER — VALACYCLOVIR HCL 1 G PO TABS: ORAL_TABLET | ORAL | Status: DC

## 2015-10-24 NOTE — Patient Instructions (Signed)
Physical in 1 year Mammogram yearly  

## 2015-10-24 NOTE — Progress Notes (Signed)
Patient ID: Samantha Eaton, female   DOB: 02/23/1961, 54 y.o.   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 PCP is Dr Willey Blade.   Current Medications, Allergies, Past Medical History, Past Surgical History, Family History and Social History were reviewed in [employer] record.     Review of Systems: Patient denies any headaches, hearing loss, fatigue, blurred vision, shortness of breath, chest pain, abdominal pain, problems with bowel movements, urination, or intercourse(not currently having). No  mood swings. See HPI for positives    Physical Exam:BP 122/80 mmHg  Pulse 82  Ht 5\' 4"  (1.626 m)  Wt 214 lb (97.07 kg)  BMI 36.72 kg/m2 General:  Well developed, well nourished, no acute distress Skin:  Warm and dry Neck:  Midline trachea, normal thyroid, good ROM, no lymphadenopathy Lungs; Clear to auscultation bilaterally Breast:  No dominant palpable mass, retraction, or nipple discharge Cardiovascular: Regular rate and rhythm Abdomen:  Soft, non tender, no hepatosplenomegaly Pelvic:  External genitalia is normal in appearance, no lesions.  The vagina is normal in appearance. Urethra has no lesions or masses. The cervix and uterus are absent.  No adnexal masses or tenderness noted.Bladder is non tender, no masses felt. Rectal: Good sphincter tone, no polyps, or hemorrhoids felt.  Hemoccult negative. Extremities/musculoskeletal:  No swelling or varicosities noted, no clubbing or cyanosis Psych:  No mood changes, alert and cooperative,seems happy Will try gabapentin to see if helps with body aches.  Impression: Well  Woman gyn exam  no pap Hypertension Elevated cholesterol Hypothyroid RLS  Anxiety  Body aches  Obesity    Plan: Check CBC,CMP,TSH and lipids,A1c Physical in 1 year Mammogram yearly Colonoscopy per GI Refilled norvasc 5 mg #90 take 1 daily with 3 refills Refilled zocor 20 mg #90 take 1 daily with 3 refills Refilled valtrex 1gm take 2 now and 2 in 2 days with 1 refill for cold sores Refilled lexarpo 10 mg #30 take 1/2 tab daily with 6 refills Refilled mobic 7.5 mg #30 take 1 daily with 6 refills  Refilled mirapex 0.5 mg 1 bid #60 with 11 refills Rx gabapentin 300 mg #90 take 1 today then 1 bid day 2 and then 1 tid with 1 refill Try weight watchers  Increase frequency of sex and use astroglide

## 2015-10-25 LAB — LIPID PANEL
CHOLESTEROL TOTAL: 176 mg/dL (ref 100–199)
Chol/HDL Ratio: 3.5 ratio units (ref 0.0–4.4)
HDL: 51 mg/dL (ref >39)
LDL Calculated: 99 mg/dL (ref 0–99)
Triglycerides: 131 mg/dL (ref 0–149)
VLDL CHOLESTEROL CAL: 26 mg/dL (ref 5–40)

## 2015-10-25 LAB — COMPREHENSIVE METABOLIC PANEL
A/G RATIO: 1.8 (ref 1.1–2.5)
ALK PHOS: 67 IU/L (ref 39–117)
ALT: 12 IU/L (ref 0–32)
AST: 16 IU/L (ref 0–40)
Albumin: 4.6 g/dL (ref 3.5–5.5)
BUN/Creatinine Ratio: 25 — ABNORMAL HIGH (ref 9–23)
BUN: 20 mg/dL (ref 6–24)
Bilirubin Total: 0.4 mg/dL (ref 0.0–1.2)
CALCIUM: 9.7 mg/dL (ref 8.7–10.2)
CO2: 24 mmol/L (ref 18–29)
CREATININE: 0.81 mg/dL (ref 0.57–1.00)
Chloride: 102 mmol/L (ref 97–106)
GFR calc Af Amer: 95 mL/min/{1.73_m2} (ref >59)
GFR, EST NON AFRICAN AMERICAN: 83 mL/min/{1.73_m2} (ref >59)
Globulin, Total: 2.6 g/dL (ref 1.5–4.5)
Glucose: 91 mg/dL (ref 65–99)
POTASSIUM: 4.7 mmol/L (ref 3.5–5.2)
Sodium: 141 mmol/L (ref 136–144)
Total Protein: 7.2 g/dL (ref 6.0–8.5)

## 2015-10-25 LAB — CBC
Hematocrit: 36.1 % (ref 34.0–46.6)
Hemoglobin: 12.2 g/dL (ref 11.1–15.9)
MCH: 30.2 pg (ref 26.6–33.0)
MCHC: 33.8 g/dL (ref 31.5–35.7)
MCV: 89 fL (ref 79–97)
PLATELETS: 583 10*3/uL — AB (ref 150–379)
RBC: 4.04 x10E6/uL (ref 3.77–5.28)
RDW: 14.1 % (ref 12.3–15.4)
WBC: 9.8 10*3/uL (ref 3.4–10.8)

## 2015-10-25 LAB — HEMOGLOBIN A1C
ESTIMATED AVERAGE GLUCOSE: 131 mg/dL
HEMOGLOBIN A1C: 6.2 % — AB (ref 4.8–5.6)

## 2015-10-25 LAB — TSH: TSH: 6.93 u[IU]/mL — AB (ref 0.450–4.500)

## 2015-10-26 ENCOUNTER — Telehealth: Payer: Self-pay | Admitting: Adult Health

## 2015-10-26 MED ORDER — LEVOTHYROXINE SODIUM 100 MCG PO TABS: 100.0000 ug | ORAL_TABLET | Freq: Every day | ORAL | Status: DC

## 2015-10-26 NOTE — Telephone Encounter (Signed)
Pt aware of labs will increase synthroid to 100 mcg and check labs in 8 weeks

## 2015-10-29 ENCOUNTER — Telehealth: Payer: Self-pay | Admitting: Adult Health

## 2015-10-29 MED ORDER — LEVOTHYROXINE SODIUM 50 MCG PO TABS: 50.0000 ug | ORAL_TABLET | Freq: Every day | ORAL | Status: DC

## 2015-10-29 NOTE — Telephone Encounter (Signed)
Samantha Eaton called and said she knows why TSH was high, has not taken meds since April per pharmacy just forgot to refill, will get her to take 50 mcg for 8 weeks and check labs

## 2015-11-23 ENCOUNTER — Ambulatory Visit: Payer: Self-pay | Admitting: Physician Assistant

## 2015-11-23 ENCOUNTER — Encounter: Payer: Self-pay | Admitting: Physician Assistant

## 2015-11-23 VITALS — BP 130/90 | HR 79 | Temp 97.6°F

## 2015-11-23 DIAGNOSIS — J018 Other acute sinusitis: Secondary | ICD-10-CM

## 2015-11-23 MED ORDER — FLUTICASONE PROPIONATE 50 MCG/ACT NA SUSP: 2.0000 | Freq: Every day | NASAL | Status: DC

## 2015-11-23 MED ORDER — AMOXICILLIN 875 MG PO TABS: 875.0000 mg | ORAL_TABLET | Freq: Two times a day (BID) | ORAL | Status: DC

## 2015-11-23 NOTE — Progress Notes (Signed)
S: C/o runny nose and congestion for 3 days, no fever, chills, cp/sob, v/d; mucus is green and thick, cough is sporadic, c/o of facial and dental pain.   Using otc meds:   O: PE: perrl eomi, normocephalic, tms dull, nasal mucosa red and swollen, throat injected, neck supple no lymph, lungs c t a, cv rrr, neuro intact  A:  Acute sinusitis   P: flonase, amoxil, drink fluids, continue regular meds , use otc meds of choice, return if not improving in 5 days, return earlier if worsening

## 2015-12-11 ENCOUNTER — Other Ambulatory Visit: Payer: Self-pay | Admitting: *Deleted

## 2015-12-11 DIAGNOSIS — E039 Hypothyroidism, unspecified: Secondary | ICD-10-CM

## 2015-12-13 ENCOUNTER — Other Ambulatory Visit: Payer: Self-pay | Admitting: *Deleted

## 2015-12-13 DIAGNOSIS — E039 Hypothyroidism, unspecified: Secondary | ICD-10-CM

## 2015-12-14 ENCOUNTER — Other Ambulatory Visit: Payer: PRIVATE HEALTH INSURANCE

## 2015-12-15 LAB — TSH: TSH: 2.69 u[IU]/mL (ref 0.450–4.500)

## 2015-12-17 ENCOUNTER — Telehealth: Payer: Self-pay | Admitting: Adult Health

## 2015-12-17 MED ORDER — LEVOTHYROXINE SODIUM 50 MCG PO TABS: 50.0000 ug | ORAL_TABLET | Freq: Every day | ORAL | Status: DC

## 2015-12-17 NOTE — Telephone Encounter (Signed)
Pt aware TSH good, continue meds

## 2015-12-21 ENCOUNTER — Encounter: Payer: Self-pay | Admitting: Physician Assistant

## 2015-12-21 ENCOUNTER — Ambulatory Visit: Payer: Self-pay | Admitting: Physician Assistant

## 2015-12-21 VITALS — BP 140/80 | HR 90 | Temp 98.4°F

## 2015-12-21 DIAGNOSIS — J209 Acute bronchitis, unspecified: Secondary | ICD-10-CM

## 2015-12-21 MED ORDER — AZITHROMYCIN 250 MG PO TABS: ORAL_TABLET | ORAL | Status: DC

## 2015-12-21 MED ORDER — PSEUDOEPH-BROMPHEN-DM 30-2-10 MG/5ML PO SYRP: 5.0000 mL | ORAL_SOLUTION | Freq: Four times a day (QID) | ORAL | Status: DC | PRN

## 2015-12-21 NOTE — Progress Notes (Signed)
   Subjective:    Patient ID: Samantha Eaton, female    DOB: 22-Feb-1961, 55 y.o.   MRN: [id number]  HPI  Patient c/o productive cough for 3 days. States cough worse at nigth and early morning. Fever/chill. Also c/o nasal congestion/ear pressure and post nasal drainage.  Denies N/V/D.   Review of Systems    Negative today except for compliant. Objective:   Physical Exam Productive cough. Edematous nasal turbinates, post nasal drainage. Neck supple without adenopathy.  Lungs with bilateral Rales.  Heart RRR.       Assessment & Plan:Bronchitis  Zithromax and Bromfed DM.  Follow up PRN.

## 2016-07-05 ENCOUNTER — Other Ambulatory Visit: Payer: Self-pay | Admitting: Adult Health

## 2016-08-05 ENCOUNTER — Other Ambulatory Visit: Payer: Self-pay | Admitting: Internal Medicine

## 2016-08-05 DIAGNOSIS — Z1231 Encounter for screening mammogram for malignant neoplasm of breast: Secondary | ICD-10-CM

## 2016-08-14 ENCOUNTER — Encounter: Payer: Self-pay | Admitting: Physician Assistant

## 2016-08-14 ENCOUNTER — Ambulatory Visit: Payer: Self-pay | Admitting: Physician Assistant

## 2016-08-14 VITALS — BP 129/79 | HR 82 | Temp 97.8°F

## 2016-08-14 DIAGNOSIS — B349 Viral infection, unspecified: Secondary | ICD-10-CM

## 2016-08-14 LAB — POCT INFLUENZA A/B
Influenza A, POC: NEGATIVE
Influenza B, POC: NEGATIVE

## 2016-08-14 NOTE — Progress Notes (Addendum)
S: C/o sore throat, runny nose and congestion for 3 days, no fever, chills, cp/sob, v/d; did feel hot/cold, has body aches, had a lot of diarrhea this weekend, thinks she feels week from that; mucus was clear throughout the day, cough is sporadic,   Using otc meds: robitussin  O: PE: vitals wnl, nad, perrl eomi, normocephalic, tms dull, nasal mucosa red and swollen, throat injected, neck supple no lymph, lungs c t a, cv rrr, neuro intact, flu swab neg  A:  Acute viral uri   P: drink fluids, continue regular meds , use otc meds of choice, return if not improving in 5 days, return earlier if worsening , pt concerned bc she doesn't have a spleen and thinks she needs antibiotics, explained to her that a virus will not respond to antibiotics and since she recently had so much diarrhea I am not comfortable giving her antibiotic bc of chance of c. Diff; if not better by Monday will consider an antibiotic  Pt called office, states she needs an antibiotic as she is not better, rx for [facility] sent to [facility] pharmacy

## 2016-08-18 MED ORDER — CEFDINIR 300 MG PO CAPS: 300.0000 mg | ORAL_CAPSULE | Freq: Two times a day (BID) | ORAL | 0 refills | Status: DC

## 2016-08-18 NOTE — Addendum Note (Signed)
Addended by: Versie Starks on: 08/18/2016 10:25 AM   Modules accepted: Orders

## 2016-08-25 ENCOUNTER — Ambulatory Visit: Payer: Self-pay

## 2016-09-05 ENCOUNTER — Other Ambulatory Visit: Payer: Self-pay | Admitting: Adult Health

## 2016-09-08 ENCOUNTER — Ambulatory Visit
Admission: RE | Admit: 2016-09-08 | Discharge: 2016-09-08 | Disposition: A | Discharge status: Home / Self Care | Payer: Self-pay | Source: Ambulatory Visit | Attending: Internal Medicine | Admitting: Internal Medicine

## 2016-09-08 DIAGNOSIS — Z1231 Encounter for screening mammogram for malignant neoplasm of breast: Secondary | ICD-10-CM

## 2016-10-23 ENCOUNTER — Other Ambulatory Visit: Payer: Self-pay | Admitting: Orthopedic Surgery

## 2016-10-28 ENCOUNTER — Other Ambulatory Visit: Payer: Self-pay | Admitting: Adult Health

## 2016-10-30 ENCOUNTER — Ambulatory Visit: Payer: Self-pay | Admitting: Physician Assistant

## 2016-10-30 ENCOUNTER — Encounter: Payer: Self-pay | Admitting: Physician Assistant

## 2016-10-30 VITALS — BP 125/79 | HR 86 | Temp 97.3°F

## 2016-10-30 DIAGNOSIS — J069 Acute upper respiratory infection, unspecified: Secondary | ICD-10-CM

## 2016-10-30 MED ORDER — AMOXICILLIN 875 MG PO TABS: 875.0000 mg | ORAL_TABLET | Freq: Two times a day (BID) | ORAL | 0 refills | Status: DC

## 2016-10-30 NOTE — Progress Notes (Signed)
S: C/o runny nose and congestion for 2 days, no fever, chills, cp/sob, v/d; mucus is green and thick, cough is sporadic, c/o of facial and dental pain.   Using otc meds:   O: PE: vitals wnl, nad, perrl eomi, normocephalic, tms dull, nasal mucosa red and swollen, throat injected, neck supple no lymph, lungs c t a, cv rrr, neuro intact  A:  Acute sinusitis   P: drink fluids, continue regular meds , use otc meds of choice, return if not improving in 5 days, return earlier if worsening , amoxil,

## 2016-10-31 ENCOUNTER — Other Ambulatory Visit: Payer: PRIVATE HEALTH INSURANCE | Admitting: Adult Health

## 2016-11-03 ENCOUNTER — Other Ambulatory Visit: Payer: Self-pay | Admitting: Adult Health

## 2016-11-06 ENCOUNTER — Encounter (HOSPITAL_BASED_OUTPATIENT_CLINIC_OR_DEPARTMENT_OTHER): Payer: Self-pay | Admitting: *Deleted

## 2016-11-11 ENCOUNTER — Encounter (HOSPITAL_BASED_OUTPATIENT_CLINIC_OR_DEPARTMENT_OTHER): Payer: Self-pay | Admitting: Certified Registered"

## 2016-11-11 ENCOUNTER — Encounter (HOSPITAL_BASED_OUTPATIENT_CLINIC_OR_DEPARTMENT_OTHER)
Admission: RE | Disposition: A | Discharge status: Home / Self Care | Payer: Self-pay | Source: Ambulatory Visit | Attending: Orthopedic Surgery

## 2016-11-11 ENCOUNTER — Ambulatory Visit (HOSPITAL_BASED_OUTPATIENT_CLINIC_OR_DEPARTMENT_OTHER): Payer: Managed Care, Other (non HMO) | Admitting: Certified Registered"

## 2016-11-11 ENCOUNTER — Ambulatory Visit (HOSPITAL_BASED_OUTPATIENT_CLINIC_OR_DEPARTMENT_OTHER)
Admission: RE | Admit: 2016-11-11 | Discharge: 2016-11-11 | Disposition: A | Discharge status: Home / Self Care | Payer: Managed Care, Other (non HMO) | Source: Ambulatory Visit | Attending: Orthopedic Surgery | Admitting: Orthopedic Surgery

## 2016-11-11 DIAGNOSIS — D869 Sarcoidosis, unspecified: Secondary | ICD-10-CM | POA: Insufficient documentation

## 2016-11-11 DIAGNOSIS — K219 Gastro-esophageal reflux disease without esophagitis: Secondary | ICD-10-CM | POA: Insufficient documentation

## 2016-11-11 DIAGNOSIS — Z825 Family history of asthma and other chronic lower respiratory diseases: Secondary | ICD-10-CM | POA: Diagnosis not present

## 2016-11-11 DIAGNOSIS — Z90710 Acquired absence of both cervix and uterus: Secondary | ICD-10-CM | POA: Insufficient documentation

## 2016-11-11 DIAGNOSIS — G4733 Obstructive sleep apnea (adult) (pediatric): Secondary | ICD-10-CM | POA: Insufficient documentation

## 2016-11-11 DIAGNOSIS — M19041 Primary osteoarthritis, right hand: Secondary | ICD-10-CM | POA: Diagnosis not present

## 2016-11-11 DIAGNOSIS — Z86718 Personal history of other venous thrombosis and embolism: Secondary | ICD-10-CM | POA: Diagnosis not present

## 2016-11-11 DIAGNOSIS — G5601 Carpal tunnel syndrome, right upper limb: Secondary | ICD-10-CM | POA: Insufficient documentation

## 2016-11-11 DIAGNOSIS — G2581 Restless legs syndrome: Secondary | ICD-10-CM | POA: Diagnosis not present

## 2016-11-11 DIAGNOSIS — F419 Anxiety disorder, unspecified: Secondary | ICD-10-CM | POA: Insufficient documentation

## 2016-11-11 DIAGNOSIS — M4690 Unspecified inflammatory spondylopathy, site unspecified: Secondary | ICD-10-CM | POA: Diagnosis not present

## 2016-11-11 DIAGNOSIS — E7800 Pure hypercholesterolemia, unspecified: Secondary | ICD-10-CM | POA: Insufficient documentation

## 2016-11-11 DIAGNOSIS — Z9081 Acquired absence of spleen: Secondary | ICD-10-CM | POA: Insufficient documentation

## 2016-11-11 DIAGNOSIS — Z8249 Family history of ischemic heart disease and other diseases of the circulatory system: Secondary | ICD-10-CM | POA: Diagnosis not present

## 2016-11-11 DIAGNOSIS — Z809 Family history of malignant neoplasm, unspecified: Secondary | ICD-10-CM | POA: Insufficient documentation

## 2016-11-11 DIAGNOSIS — I10 Essential (primary) hypertension: Secondary | ICD-10-CM | POA: Diagnosis not present

## 2016-11-11 DIAGNOSIS — E039 Hypothyroidism, unspecified: Secondary | ICD-10-CM | POA: Insufficient documentation

## 2016-11-11 DIAGNOSIS — K5900 Constipation, unspecified: Secondary | ICD-10-CM | POA: Diagnosis not present

## 2016-11-11 DIAGNOSIS — Z833 Family history of diabetes mellitus: Secondary | ICD-10-CM | POA: Diagnosis not present

## 2016-11-11 DIAGNOSIS — Z6837 Body mass index (BMI) 37.0-37.9, adult: Secondary | ICD-10-CM | POA: Diagnosis not present

## 2016-11-11 DIAGNOSIS — Z79899 Other long term (current) drug therapy: Secondary | ICD-10-CM | POA: Insufficient documentation

## 2016-11-11 DIAGNOSIS — Z836 Family history of other diseases of the respiratory system: Secondary | ICD-10-CM | POA: Insufficient documentation

## 2016-11-11 DIAGNOSIS — Z8379 Family history of other diseases of the digestive system: Secondary | ICD-10-CM | POA: Insufficient documentation

## 2016-11-11 HISTORY — PX: CARPAL TUNNEL RELEASE: SHX101

## 2016-11-11 HISTORY — PX: DISTAL INTERPHALANGEAL JOINT FUSION: SHX6428

## 2016-11-11 SURGERY — CARPAL TUNNEL RELEASE
Anesthesia: Regional | Site: Hand | Laterality: Right

## 2016-11-11 MED ORDER — HYDROCODONE-ACETAMINOPHEN 5-325 MG PO TABS
ORAL_TABLET | ORAL | Status: AC
  Filled 2016-11-11: qty 1

## 2016-11-11 MED ORDER — HYDROMORPHONE HCL 1 MG/ML IJ SOLN: 0.2500 mg | INTRAMUSCULAR | Status: DC | PRN

## 2016-11-11 MED ORDER — MIDAZOLAM HCL 2 MG/2ML IJ SOLN
INTRAMUSCULAR | Status: AC
  Filled 2016-11-11: qty 2

## 2016-11-11 MED ORDER — MEPERIDINE HCL 25 MG/ML IJ SOLN: 6.2500 mg | INTRAMUSCULAR | Status: DC | PRN

## 2016-11-11 MED ORDER — HYDROCODONE-ACETAMINOPHEN 5-325 MG PO TABS: 1.0000 | ORAL_TABLET | Freq: Four times a day (QID) | ORAL | 0 refills | Status: DC | PRN

## 2016-11-11 MED ORDER — LIDOCAINE HCL (CARDIAC) 20 MG/ML IV SOLN
INTRAVENOUS | Status: DC | PRN
  Administered 2016-11-11: 30 mg via INTRAVENOUS

## 2016-11-11 MED ORDER — EPHEDRINE SULFATE 50 MG/ML IJ SOLN
INTRAMUSCULAR | Status: DC | PRN
  Administered 2016-11-11 (×2): 10 mg via INTRAVENOUS

## 2016-11-11 MED ORDER — LACTATED RINGERS IV SOLN
INTRAVENOUS | Status: DC
  Administered 2016-11-11 (×2): via INTRAVENOUS

## 2016-11-11 MED ORDER — PROPOFOL 10 MG/ML IV BOLUS
INTRAVENOUS | Status: DC | PRN
  Administered 2016-11-11: 200 mg via INTRAVENOUS

## 2016-11-11 MED ORDER — 0.9 % SODIUM CHLORIDE (POUR BTL) OPTIME
TOPICAL | Status: DC | PRN
  Administered 2016-11-11: 200 mL

## 2016-11-11 MED ORDER — ONDANSETRON HCL 4 MG/2ML IJ SOLN
INTRAMUSCULAR | Status: DC | PRN
  Administered 2016-11-11 (×2): 4 mg via INTRAVENOUS

## 2016-11-11 MED ORDER — FENTANYL CITRATE (PF) 100 MCG/2ML IJ SOLN
INTRAMUSCULAR | Status: AC
  Filled 2016-11-11: qty 2

## 2016-11-11 MED ORDER — LIDOCAINE HCL (PF) 2 % IJ SOLN
INTRAMUSCULAR | Status: DC | PRN
Start: 2016-11-11 — End: 2016-11-11
  Administered 2016-11-11: 10 mL via INTRADERMAL

## 2016-11-11 MED ORDER — ROPIVACAINE HCL 7.5 MG/ML IJ SOLN
INTRAMUSCULAR | Status: DC | PRN
  Administered 2016-11-11: 30 mL via PERINEURAL

## 2016-11-11 MED ORDER — BUPIVACAINE HCL (PF) 0.25 % IJ SOLN
INTRAMUSCULAR | Status: AC
  Filled 2016-11-11: qty 30

## 2016-11-11 MED ORDER — PROMETHAZINE HCL 25 MG/ML IJ SOLN: 6.2500 mg | INTRAMUSCULAR | Status: DC | PRN

## 2016-11-11 MED ORDER — PROPOFOL 500 MG/50ML IV EMUL
INTRAVENOUS | Status: DC | PRN
  Administered 2016-11-11: 50 ug/kg/min via INTRAVENOUS

## 2016-11-11 MED ORDER — FENTANYL CITRATE (PF) 100 MCG/2ML IJ SOLN
50.0000 ug | INTRAMUSCULAR | Status: DC | PRN
  Administered 2016-11-11: 100 ug via INTRAVENOUS

## 2016-11-11 MED ORDER — SCOPOLAMINE 1 MG/3DAYS TD PT72
1.0000 | MEDICATED_PATCH | Freq: Once | TRANSDERMAL | Status: DC | PRN
Start: 2016-11-11 — End: 2016-11-11

## 2016-11-11 MED ORDER — MIDAZOLAM HCL 2 MG/2ML IJ SOLN
1.0000 mg | INTRAMUSCULAR | Status: DC | PRN
  Administered 2016-11-11: 2 mg via INTRAVENOUS

## 2016-11-11 MED ORDER — CEFAZOLIN SODIUM-DEXTROSE 2-4 GM/100ML-% IV SOLN
INTRAVENOUS | Status: AC
  Filled 2016-11-11: qty 100

## 2016-11-11 MED ORDER — CHLORHEXIDINE GLUCONATE 4 % EX LIQD: 60.0000 mL | Freq: Once | CUTANEOUS | Status: DC

## 2016-11-11 MED ORDER — HYDROCODONE-ACETAMINOPHEN 5-325 MG PO TABS
1.0000 | ORAL_TABLET | Freq: Once | ORAL | Status: AC
  Administered 2016-11-11: 1 via ORAL

## 2016-11-11 MED ORDER — CEFAZOLIN SODIUM-DEXTROSE 2-4 GM/100ML-% IV SOLN
2.0000 g | INTRAVENOUS | Status: AC
  Administered 2016-11-11: 2 g via INTRAVENOUS

## 2016-11-11 MED ORDER — DEXAMETHASONE SODIUM PHOSPHATE 10 MG/ML IJ SOLN
INTRAMUSCULAR | Status: DC | PRN
  Administered 2016-11-11: 10 mg via INTRAVENOUS

## 2016-11-11 SURGICAL SUPPLY — 53 items
.035 KWIRE ×4 IMPLANT
1.7 DRILL ×2 IMPLANT
2.0 x 3.2 cannulated headless screw ×2 IMPLANT
2.0 x 34 cannulated headless screw ×2 IMPLANT
BLADE MINI RND TIP GREEN BEAV (BLADE) ×3 IMPLANT
BLADE SURG 15 STRL LF DISP TIS (BLADE) ×1 IMPLANT
BLADE SURG 15 STRL SS (BLADE) ×3
BNDG CMPR 9X4 STRL LF SNTH (GAUZE/BANDAGES/DRESSINGS) ×1
BNDG COHESIVE 1X5 TAN STRL LF (GAUZE/BANDAGES/DRESSINGS) ×3 IMPLANT
BNDG COHESIVE 3X5 TAN STRL LF (GAUZE/BANDAGES/DRESSINGS) ×3 IMPLANT
BNDG ESMARK 4X9 LF (GAUZE/BANDAGES/DRESSINGS) ×3 IMPLANT
BNDG GAUZE ELAST 4 BULKY (GAUZE/BANDAGES/DRESSINGS) ×3 IMPLANT
BUR FAST CUTTING MED (BURR) IMPLANT
CHLORAPREP W/TINT 26ML (MISCELLANEOUS) ×3 IMPLANT
CORDS BIPOLAR (ELECTRODE) ×3 IMPLANT
COVER BACK TABLE 60X90IN (DRAPES) ×3 IMPLANT
COVER MAYO STAND STRL (DRAPES) ×3 IMPLANT
CUFF TOURNIQUET SINGLE 18IN (TOURNIQUET CUFF) ×3 IMPLANT
DRAPE EXTREMITY T 121X128X90 (DRAPE) ×3 IMPLANT
DRAPE OEC MINIVIEW 54X84 (DRAPES) ×3 IMPLANT
DRAPE SURG 17X23 STRL (DRAPES) ×3 IMPLANT
DRSG PAD ABDOMINAL 8X10 ST (GAUZE/BANDAGES/DRESSINGS) ×3 IMPLANT
GAUZE SPONGE 4X4 12PLY STRL (GAUZE/BANDAGES/DRESSINGS) ×3 IMPLANT
GAUZE XEROFORM 1X8 LF (GAUZE/BANDAGES/DRESSINGS) ×3 IMPLANT
GLOVE BIOGEL PI IND STRL 8.5 (GLOVE) ×1 IMPLANT
GLOVE BIOGEL PI INDICATOR 8.5 (GLOVE) ×2
GLOVE SURG ORTHO 8.0 STRL STRW (GLOVE) ×3 IMPLANT
GOWN STRL REUS W/ TWL LRG LVL3 (GOWN DISPOSABLE) ×1 IMPLANT
GOWN STRL REUS W/TWL LRG LVL3 (GOWN DISPOSABLE) ×3
GOWN STRL REUS W/TWL XL LVL3 (GOWN DISPOSABLE) ×3 IMPLANT
NDL PRECISIONGLIDE 27X1.5 (NEEDLE) IMPLANT
NEEDLE PRECISIONGLIDE 27X1.5 (NEEDLE) IMPLANT
NS IRRIG 1000ML POUR BTL (IV SOLUTION) ×3 IMPLANT
PACK BASIN DAY SURGERY FS (CUSTOM PROCEDURE TRAY) ×3 IMPLANT
PAD CAST 3X4 CTTN HI CHSV (CAST SUPPLIES) ×1 IMPLANT
PADDING CAST ABS 3INX4YD NS (CAST SUPPLIES)
PADDING CAST ABS 4INX4YD NS (CAST SUPPLIES) ×2
PADDING CAST ABS COTTON 3X4 (CAST SUPPLIES) IMPLANT
PADDING CAST ABS COTTON 4X4 ST (CAST SUPPLIES) ×1 IMPLANT
PADDING CAST COTTON 3X4 STRL (CAST SUPPLIES) ×3
SLEEVE SCD COMPRESS KNEE MED (MISCELLANEOUS) IMPLANT
SLING ARM FOAM STRAP LRG (SOFTGOODS) ×2 IMPLANT
SPLINT FINGER 3.25 BULB 911905 (SOFTGOODS) ×4 IMPLANT
SPLINT PLASTER CAST XFAST 3X15 (CAST SUPPLIES) ×10 IMPLANT
SPLINT PLASTER XTRA FASTSET 3X (CAST SUPPLIES)
SPONGE GAUZE 4X4 12PLY STER LF (GAUZE/BANDAGES/DRESSINGS) ×2 IMPLANT
STOCKINETTE 4X48 STRL (DRAPES) ×3 IMPLANT
SUT ETHILON 4 0 PS 2 18 (SUTURE) ×5 IMPLANT
SUT VICRYL 4-0 PS2 18IN ABS (SUTURE) ×3 IMPLANT
SYR BULB 3OZ (MISCELLANEOUS) ×3 IMPLANT
SYR CONTROL 10ML LL (SYRINGE) IMPLANT
TOWEL OR 17X24 6PK STRL BLUE (TOWEL DISPOSABLE) ×3 IMPLANT
UNDERPAD 30X30 (UNDERPADS AND DIAPERS) ×3 IMPLANT

## 2016-11-11 NOTE — Anesthesia Preprocedure Evaluation (Addendum)
Anesthesia Evaluation  Patient identified by MRN, date of birth, ID band Patient awake    Reviewed: Allergy & Precautions, H&P , NPO status , Patient's Chart, lab work & pertinent test results  History of Anesthesia Complications (+) PONV and history of anesthetic complications  Airway Mallampati: I  TM Distance: >3 FB Neck ROM: Full    Dental  (+) Edentulous Upper, Dental Advisory Given   Pulmonary sleep apnea and Continuous Positive Airway Pressure Ventilation ,  sarcoid   breath sounds clear to auscultation       Cardiovascular hypertension, Pt. on medications (-) angina+ DVT  Normal cardiovascular exam Rhythm:Regular Rate:Normal     Neuro/Psych  Headaches, PSYCHIATRIC DISORDERS Anxiety negative neurological ROS     GI/Hepatic Neg liver ROS, GERD  Medicated and Controlled,  Endo/Other  Hypothyroidism Morbid obesity  Renal/GU negative Renal ROS     Musculoskeletal  (+) Arthritis ,   Abdominal (+) + obese,   Peds  Hematology negative hematology ROS (+)   Anesthesia Other Findings   Reproductive/Obstetrics                             Anesthesia Physical  Anesthesia Plan  ASA: III  Anesthesia Plan: General and Regional   Post-op Pain Management: GA combined w/ Regional for post-op pain   Induction: Intravenous  Airway Management Planned: LMA  Additional Equipment:   Intra-op Plan:   Post-operative Plan: Extubation in OR  Informed Consent: I have reviewed the patients History and Physical, chart, labs and discussed the procedure including the risks, benefits and alternatives for the proposed anesthesia with the patient or authorized representative who has indicated his/her understanding and acceptance.   Dental advisory given  Plan Discussed with: CRNA  Anesthesia Plan Comments: (Plan routine monitors, GA-LMA OK, axillary block for post op analgesia)        Anesthesia Quick Evaluation

## 2016-11-11 NOTE — Anesthesia Postprocedure Evaluation (Signed)
Anesthesia Post Note  Patient: BAR STEINBERGER  Procedure(s) Performed: Procedure(s) (LRB): CARPAL TUNNEL RELEASE, right (Right) DISTAL INTERPHALANGEAL JOINT FUSION right index and ring (Right)  Patient location during evaluation: PACU Anesthesia Type: General and Regional Level of consciousness: sedated and patient cooperative Pain management: pain level controlled Vital Signs Assessment: post-procedure vital signs reviewed and stable Respiratory status: spontaneous breathing Cardiovascular status: stable Anesthetic complications: no       Last Vitals:  Vitals:   11/11/16 1315 11/11/16 1355  BP: (!) 121/59 (!) 159/64  Pulse: 94 93  Resp: 20 18  Temp:  36.6 C    Last Pain:  Vitals:   11/11/16 1355  TempSrc:   PainSc: 2                  Nolon Nations

## 2016-11-11 NOTE — Anesthesia Procedure Notes (Addendum)
Anesthesia Regional Block:  Axillary brachial plexus block  Pre-Anesthetic Checklist: ,, timeout performed, Correct Patient, Correct Site, Correct Laterality, Correct Procedure, Correct Position, site marked, Risks and benefits discussed,  Surgical consent,  Pre-op evaluation,  At surgeon's request and post-op pain management  Laterality: Right  Prep: chloraprep       Needles:  Injection technique: Single-shot  Needle Type: Stimulator Needle - 40     Needle Length: 4cm 4 cm Needle Gauge: 22 and 22 G    Additional Needles:  Procedures: ultrasound guided (picture in chart) Axillary brachial plexus block Narrative:  Start time: 11/11/2016 10:50 AM End time: 11/11/2016 11:00 AM Injection made incrementally with aspirations every 5 mL. Anesthesiologist: Nolon Nations  Additional Notes: BP cuff, EKG monitors applied. Sedation begun. Nerve location verified with U/S. Anesthetic injected incrementally, slowly , and after neg aspirations under direct u/s guidance. Good perineural spread. Tolerated well.

## 2016-11-11 NOTE — Anesthesia Procedure Notes (Signed)
Procedure Name: LMA Insertion Date/Time: 11/11/2016 11:30 AM Performed by: Breyson Kelm D Pre-anesthesia Checklist: Patient identified, Emergency Drugs available, Suction available and Patient being monitored Patient Re-evaluated:Patient Re-evaluated prior to inductionOxygen Delivery Method: Circle system utilized Preoxygenation: Pre-oxygenation with 100% oxygen Intubation Type: IV induction Ventilation: Mask ventilation without difficulty LMA: LMA inserted LMA Size: 4.0 Number of attempts: 1 Airway Equipment and Method: Bite block Placement Confirmation: positive ETCO2 Tube secured with: Tape Dental Injury: Teeth and Oropharynx as per pre-operative assessment

## 2016-11-11 NOTE — Op Note (Signed)
Dictation Number [id number]

## 2016-11-11 NOTE — Anesthesia Procedure Notes (Signed)
Procedure Name: MAC Date/Time: 11/11/2016 11:25 AM Performed by: Swade Shonka D Pre-anesthesia Checklist: Emergency Drugs available, Patient identified, Suction available, Patient being monitored and Timeout performed Patient Re-evaluated:Patient Re-evaluated prior to inductionOxygen Delivery Method: Simple face mask

## 2016-11-11 NOTE — H&P (Signed)
Samantha Eaton is an 55 y.o. female.   Chief Complaint: numbness right hand and pain DIP joints index and ring fingers HPI: Samantha Eaton is a 55 year old right-hand-dominant female who has had carpal tunnel release along with fusions of the index middle fingers DIP joints at that time. These of done extremely well. She comes in with complaint now of similar problems on her right hand with carpal tunnel syndrome and pain in the DIP joints of the index and  ring fingers. This been going on since the summer. She has no history of injury. She is awakened almost every night requiring shaking of her hand to wake the fingers up. She has had positive nerve conductions done by Dr. Janyth Pupa 2015 bilaterally. He has a history of arthritis no history of diabetes thyroid problems or gout. He has been taking Mobic for this. She also has Voltaren gel which is no longer giving her any relief.      Past Medical History:  Diagnosis Date  . Anxiety   . Arthritis    back, neck, hands  . Blood dyscrasia    SArcoidosis  . Burning with urination 09/27/2014  . Constipation   . DVT (deep venous thrombosis) ([facility])    right leg, years ago  . Elevated cholesterol 09/19/2013  . GERD (gastroesophageal reflux disease)   . Heartburn 09/19/2013  . Hemorrhoids   . HTN (hypertension)    not on medication  . Hypothyroidism   . Migraines   . Obstructive sleep apnea   . Overweight(278.02)   . PONV (postoperative nausea and vomiting)   . RLS (restless legs syndrome)   . Sarcoidosis ([place])    in remission for over ten years    Past Surgical History:  Procedure Laterality Date  . Bilateral tubal ligation    . CARPAL TUNNEL RELEASE Left 06/20/2014   Procedure: LEFT CARPAL TUNNEL RELEASE;  Surgeon: Daryll Brod, MD;  Location: [place];  Service: Orthopedics;  Laterality: Left;  . CESAREAN SECTION     x 2  . COLONOSCOPY  11/20/2011   Procedure: COLONOSCOPY;  Surgeon: Daneil Dolin, MD;  [facility];  Service: Endoscopy;  Laterality: N/A;  7:30  . DISTAL INTERPHALANGEAL JOINT FUSION Left 06/20/2014   Procedure: DISTAL INTERPHALANGEAL JOINT FUSION LEFT INDEX, MIDDLE FINGERS;  Surgeon: Daryll Brod, MD;  Location: [place];  Service: Orthopedics;  Laterality: Left;  . Left knee  melanoma excsion    . LYMPH NODE BIOPSY  1992   removed from chest area via ant neck excision for biopsy- sarcoidosis dx  . skin tag removed     perianal, Dr. Glo Herring  . 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 SPLENECTOMY    . TOTAL ABDOMINAL HYSTERECTOMY  2003   Left ovary removed    Family History  Problem Relation Age of Onset  . Liver disease Maternal Grandmother     ???  . Diabetes Maternal Grandmother   . Heart attack Mother 88    deceased  . Diabetes Mother   . Heart attack Father 13    deceased  . Diabetes Paternal Grandmother   . Diabetes Sister   . Cancer Sister   . Diabetes Sister   . Diabetes Sister   . Other Brother     Black lung  . Heart attack Sister   . Asthma Son   . Colon cancer Neg Hx   . Breast cancer Neg Hx   .  Ovarian cancer Neg Hx    Social History:  reports that she has never smoked. She has never used smokeless tobacco. She reports that she drinks alcohol. She reports that she does not use drugs.  Allergies: No Known Allergies  Medications Prior to Admission  Medication Sig Dispense Refill  . acetaminophen (TYLENOL) 500 MG tablet Take 1,000 mg by mouth every 8 (eight) hours as needed.    Marland Kitchen amLODipine (NORVASC) 5 MG tablet Take 1 tablet (5 mg total) by mouth daily. 90 tablet 3  . cetirizine (ZYRTEC) 10 MG tablet Take 10 mg by mouth daily.    . Cyanocobalamin (VITAMIN B 12 PO) Take by mouth.    . escitalopram (LEXAPRO) 10 MG tablet TAKE ONE TABLET ONCE DAILY 30 tablet 11  . levothyroxine (SYNTHROID, LEVOTHROID) 50 MCG tablet Take 1 tablet (50 mcg total) by mouth daily before breakfast. 30 tablet 10  . meloxicam (MOBIC) 7.5 MG tablet TAKE  ONE (1) TABLET BY MOUTH EVERY DAY 30 tablet 3  . Multiple Vitamins-Minerals (HAIR SKIN AND NAILS FORMULA) TABS Take by mouth.    . pramipexole (MIRAPEX) 0.5 MG tablet TAKE ONE TABLET BY MOUTH TWICE A DAY 60 tablet 1  . simvastatin (ZOCOR) 20 MG tablet Take 1 tablet (20 mg total) by mouth daily. 90 tablet 3  . amoxicillin (AMOXIL) 875 MG tablet Take 1 tablet (875 mg total) by mouth 2 (two) times daily. 20 tablet 0  . gabapentin (NEURONTIN) 300 MG capsule Take 1 capsule (300 mg total) by mouth 3 (three) times daily. 90 capsule 1  . valACYclovir (VALTREX) 1000 MG tablet Take 2 then 2 next day for fever blisters prn 30 tablet 1    No results found for this or any previous visit (from the past 48 hour(s)).  No results found.   Pertinent items are noted in HPI.  Height 5\' 5"  (1.651 m), weight 86.2 kg (190 lb).  General appearance: alert, cooperative and appears stated age Head: Normocephalic, without obvious abnormality Neck: no JVD Resp: clear to auscultation bilaterally Cardio: regular rate and rhythm, S1, S2 normal, no murmur, click, rub or gallop GI: soft, non-tender; bowel sounds normal; no masses,  no organomegaly Extremities: numbness right hand and [ain and DJD DIP index and ring fingers Pulses: 2+ and symmetric Skin: Skin color, texture, turgor normal. No rashes or lesions Neurologic: Grossly normal Incision/Wound: na  Assessment/Plan Assessment:  1. Carpal tunnel syndrome of right wrist  2. Osteoarthritis of finger of right hand    Plan: She would like to proceed to have carpal tunnel release infusions done to her right index and ring fingers. Pre-peri-and postoperative course are discussed along with risks and complications. She is aware that there is no guarantee to the surgery the possibility of infection recurrence injury to arteries nerves tendons incomplete relief of symptoms and dystrophy. She is scheduled for fusion DIP joints right index ring finger and carpal tunnel  release right hand as an outpatient.      Kimerly Rowand R 11/11/2016, 10:10 AM

## 2016-11-11 NOTE — Discharge Instructions (Addendum)
Regional Anesthesia Blocks  1. Numbness or the inability to move the "blocked" extremity may last from 3-48 hours after placement. The length of time depends on the medication injected and your individual response to the medication. If the numbness is not going away after 48 hours, call your surgeon.  2. The extremity that is blocked will need to be protected until the numbness is gone and the  Strength has returned. Because you cannot feel it, you will need to take extra care to avoid injury. Because it may be weak, you may have difficulty moving it or using it. You may not know what position it is in without looking at it while the block is in effect.  3. For blocks in the legs and feet, returning to weight bearing and walking needs to be done carefully. You will need to wait until the numbness is entirely gone and the strength has returned. You should be able to move your leg and foot normally before you try and bear weight or walk. You will need someone to be with you when you first try to ensure you do not fall and possibly risk injury.  4. Bruising and tenderness at the needle site are common side effects and will resolve in a few days.  5. Persistent numbness or new problems with movement should be communicated to the surgeon or the [place] [phone] [place] [phone]).[facility] Instructions Hand Surgery  Wound Care: Keep your hand elevated above the level of your heart.  Do not allow it to dangle by your side.  Keep the dressing dry and do not remove it unless your doctor advises you to do so.  He will usually change it at the time of your post-op visit.  Moving your fingers is advised to stimulate circulation but will depend on the site of your surgery.  If you have a splint applied, your doctor will advise you regarding movement.  Activity: Do not drive or operate machinery today.  Rest today and then you may return to your normal activity and  work as indicated by your physician.  Diet:  Drink liquids today or eat a light diet.  You may resume a regular diet tomorrow.    General expectations: Pain for two to three days. Fingers may become slightly swollen.  Call your doctor if any of the following occur: Severe pain not relieved by pain medication. Elevated temperature. Dressing soaked with blood. Inability to move fingers. White or bluish color to fingers.    Post Anesthesia Home Care Instructions  Activity: Get plenty of rest for the remainder of the day. A responsible adult should stay with you for 24 hours following the procedure.  For the next 24 hours, DO NOT: -Drive a car -Paediatric nurse -Drink alcoholic beverages -Take any medication unless instructed by your physician -Make any legal decisions or sign important papers.  Meals: Start with liquid foods such as gelatin or soup. Progress to regular foods as tolerated. Avoid greasy, spicy, heavy foods. If nausea and/or vomiting occur, drink only clear liquids until the nausea and/or vomiting subsides. Call your physician if vomiting continues.  Special Instructions/Symptoms: Your throat may feel dry or sore from the anesthesia or the breathing tube placed in your throat during surgery. If this causes discomfort, gargle with warm salt water. The discomfort should disappear within 24 hours.  If you had a scopolamine patch placed behind your ear for the management of post- operative nausea and/or vomiting:  of post- operative nausea and/or vomiting: ° °1. The medication in the patch is effective for 72 hours, after which it should be removed.  Wrap patch in a tissue and discard in the trash. Wash hands thoroughly with soap and water. °2. You may remove the patch earlier than 72 hours if you experience unpleasant side effects which may include dry mouth, dizziness or visual disturbances. °3. Avoid touching the patch. Wash your hands with soap and water after contact with the patch. °  ° ° °

## 2016-11-11 NOTE — Progress Notes (Signed)
Assisted Dr. Germeroth with left, ultrasound guided, axillary block. Side rails up, monitors on throughout procedure. See vital signs in flow sheet. Tolerated Procedure well. 

## 2016-11-11 NOTE — Brief Op Note (Signed)
11/11/2016  12:48 PM  PATIENT:  [name]  55 y.o. female  PRE-OPERATIVE DIAGNOSIS:  carpal tunnel syndrome degenerative joint disease digital interphalangeal  right index and ring fingers  POST-OPERATIVE DIAGNOSIS:  carpal tunnel syndrome degenerative joint disease  PROCEDURE:  Procedure(s): CARPAL TUNNEL RELEASE, right (Right) DISTAL INTERPHALANGEAL JOINT FUSION right index and ring (Right)  SURGEON:  Surgeon(s) and Role:    * Daryll Brod, MD - Primary  PHYSICIAN ASSISTANT:   ASSISTANTS: none   ANESTHESIA:   regional and general  EBL:  Total I/O In: 1200 [I.V.:1200] Out: -   BLOOD ADMINISTERED:none  DRAINS: none   LOCAL MEDICATIONS USED:  NONE  SPECIMEN:  No Specimen  DISPOSITION OF SPECIMEN:  N/A  COUNTS:  YES  TOURNIQUET:   Total Tourniquet Time Documented: Upper Arm (Right) - 74 minutes Total: Upper Arm (Right) - 74 minutes   DICTATION: .Other Dictation: Dictation Number [id number]  PLAN OF CARE: Discharge to home after PACU  PATIENT DISPOSITION:  PACU - hemodynamically stable.

## 2016-11-11 NOTE — Transfer of Care (Signed)
Immediate Anesthesia Transfer of Care Note  Patient: Samantha Eaton  Procedure(s) Performed: Procedure(s): CARPAL TUNNEL RELEASE, right (Right) DISTAL INTERPHALANGEAL JOINT FUSION right index and ring (Right)  Patient Location: PACU  Anesthesia Type:GA combined with regional for post-op pain  Level of Consciousness: awake and patient cooperative  Airway & Oxygen Therapy: Patient Spontanous Breathing and Patient connected to face mask oxygen  Post-op Assessment: Report given to RN and Post -op Vital signs reviewed and stable  Post vital signs: Reviewed and stable  Last Vitals:  Vitals:   11/11/16 1107 11/11/16 1108  BP:    Pulse: 66 65  Resp: 18 19  Temp:      Last Pain:  Vitals:   11/11/16 1041  TempSrc: Oral  PainSc: 5       Patients Stated Pain Goal: 2 ([id number])  Complications: No apparent anesthesia complications

## 2016-11-12 ENCOUNTER — Encounter (HOSPITAL_BASED_OUTPATIENT_CLINIC_OR_DEPARTMENT_OTHER): Payer: Self-pay | Admitting: Orthopedic Surgery

## 2016-11-12 NOTE — Op Note (Signed)
Samantha Eaton, Samantha Eaton             ACCOUNT NO.:  [id number]  MEDICAL RECORD NO.:  [id number]  LOCATION:                                 FACILITY:  PHYSICIAN:  Daryll Brod, M.D.            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  OPERATION:  Carpal tunnel release of right hand with fusion of distal interphalangeal joints of index and ring fingers, right hand.  SURGEON:  Daryll Brod, M.D.  ANESTHESIA:  General with regional block.  PLACE OF SURGERY:  [facility].  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 She is complaining of pain at the DIP joints of her index and ring fingers.  She is complaining of numbness and tingling.  Nerve conductions are positive.  She has failed conservative treatment for her arthritis.  Preoperative area, the patient is seen, the extremity marked by both patient and surgeon, and antibiotic given.  She is aware that there is no guarantee with the surgery; the possibility of infection; recurrence of injury to arteries, nerves, tendons; incomplete relief of symptoms and dystrophy; the possibility of nonunion of the fusion site.  PROCEDURE IN DETAIL:  The patient was brought to the operating room, where a supraclavicular block was carried out in the preoperative area under the direction of the Anesthesia department.  She was placed in a supine position and prepped and draped with ChloraPrep.  She had some feeling with the tourniquet and was given a general anesthetic.  A 3- minute dry  time was allowed and a time-out taken, confirming the patient and procedure.  The carpal tunnel was attended to first.  The limb was exsanguinated with an Esmarch bandage, tourniquet placed high and the arm was inflated to 250 mmHg.  A longitudinal incision was made in the right palm, carried down through subcutaneous tissue.  Bleeders were electrocauterized with bipolar.  The palmar fascia was split. Superficial palmar arch was identified.  Flexor tendon of the ring and little finger was identified.  Retractors were placed protecting the median nerve radially and ulnar nerve ulnarly.  The flexor retinaculum was then incised on its ulnar border.  A right angle and Sewall retractor were placed between skin and forearm fascia.  The nerve was dissected free from the overlying fascia with blunt scissors.  The flexor retinaculum proximal aspect in distal forearm fascia was then released for approximately 2 cm proximal to the wrist crease under direct vision.  The canal was explored.  The nerve had a large persistent median artery.  Motor branch entered into muscle distally. Air of compression to the nerve was apparent.  No further lesions were identified.  The skin was then closed with interrupted 4-0 nylon sutures.  The index finger was attended to next.  A  dorsal H incision was made over the distal interphalangeal joint.  This was carried down through subcutaneous tissue.  The extensor tendon was identified. Bleeders were electrocauterized with bipolar.  The tendon was transected proximal to the distal interphalangeal joint with a Beaver blade.  The joint was then opened.  Significant degenerative changes with total loss of cartilage with subchondral bones and area was noted.  The collateral ligaments were incised.  This allowed the joint to be shotgunned open. A rongeur was then used to remove the remaining cartilage and subchondral bone condyles.  The distal phalanx was then  similarly attended to with a rongeur.  This allowed a good bleeding surface.  A guide pin was then inserted in a proximal to distal direction through the distal phalanx.  X-rays confirmed that lied in good position.  A second K-wire was then used to produce a hole into the distal portion of the middle phalanx.  This was placed down the shaft of the middle phalanx and confirmed with x-rays.  The guide pin in the distal phalanx was then inserted and driven down the shaft through the hole that was originally fabricated in the middle phalanx.  This measured 38 mm.  A 32- mm screw was then selected.  This was approached distally and drilled. The screw was then inserted after making an incision around the guide pin at the tip of the distal pole.  This positioning of the screw inside the bone was confirmed with good compression of the distal interphalangeal joint with image intensification.  This was done both in AP and lateral directions.  The ring finger was attended to next.  In a similar manner, an H incision was made over the distal interphalangeal joint, carried down through subcutaneous tissue with cauterization of the dorsal veins with bipolar.  The extensor tendon was transected proximally.  Significant degenerative changes and alteration of the distal interphalangeal joint were noted on the ring finger.  This was removed as much as possible to allow visualization of the subchondral bone proximally and distally in the proximal aspect of the distal phalanx and distal aspect of the middle phalanx.  Similar to the index finger, two guide pins were then passed into each phalanx.  The position was confirmed with image intensification.  A guidewire in the distal phalanx was then inserted into the hole in the middle phalanx.  The incision was made distally, the length of the screw was measured at 42 and a 38 mm screw was selected.  This was then placed and confirmed in position in both AP and  lateral x-rays with good compression of the distal interphalangeal joint.  The 2 wounds were then irrigated with saline.  The extensor tendon was repaired with figure-of-eight 5-0 Vicryl sutures.  The skin was closed with interrupted 4-0 nylon sutures including the incisions in the distal portion of the distal phalanx. Sterile compressive dressings were applied to the carpal tunnel release and to the fingers along with splints in each of the fingers.  A Coban wrap was applied.  The tourniquet was deflated.  Remaining fingers all immediately pinked.  She was taken to the recovery room for observation in satisfactory condition.  She will be discharged to home to return to the [facility] in 1 week, on [place].          ______________________________ Daryll Brod, M.D.     GK/MEDQ  D:  11/11/2016  T:  11/12/2016  Job:  [id number]

## 2016-11-19 ENCOUNTER — Other Ambulatory Visit: Payer: Self-pay | Admitting: Adult Health

## 2016-11-25 ENCOUNTER — Ambulatory Visit (INDEPENDENT_AMBULATORY_CARE_PROVIDER_SITE_OTHER): Payer: Managed Care, Other (non HMO) | Admitting: Adult Health

## 2016-11-25 ENCOUNTER — Encounter: Payer: Self-pay | Admitting: Adult Health

## 2016-11-25 VITALS — BP 120/61 | HR 66 | Ht 65.0 in | Wt 230.0 lb

## 2016-11-25 DIAGNOSIS — E038 Other specified hypothyroidism: Secondary | ICD-10-CM

## 2016-11-25 DIAGNOSIS — E7800 Pure hypercholesterolemia, unspecified: Secondary | ICD-10-CM

## 2016-11-25 DIAGNOSIS — I10 Essential (primary) hypertension: Secondary | ICD-10-CM | POA: Diagnosis not present

## 2016-11-25 DIAGNOSIS — Z1212 Encounter for screening for malignant neoplasm of rectum: Secondary | ICD-10-CM | POA: Diagnosis not present

## 2016-11-25 DIAGNOSIS — Z01411 Encounter for gynecological examination (general) (routine) with abnormal findings: Secondary | ICD-10-CM

## 2016-11-25 DIAGNOSIS — F419 Anxiety disorder, unspecified: Secondary | ICD-10-CM | POA: Diagnosis not present

## 2016-11-25 DIAGNOSIS — Z01419 Encounter for gynecological examination (general) (routine) without abnormal findings: Secondary | ICD-10-CM

## 2016-11-25 DIAGNOSIS — Z1211 Encounter for screening for malignant neoplasm of colon: Secondary | ICD-10-CM

## 2016-11-25 DIAGNOSIS — G2581 Restless legs syndrome: Secondary | ICD-10-CM

## 2016-11-25 LAB — HEMOCCULT GUIAC POC 1CARD (OFFICE): Fecal Occult Blood, POC: NEGATIVE

## 2016-11-25 MED ORDER — ESTRADIOL 0.1 MG/GM VA CREA: TOPICAL_CREAM | VAGINAL | 0 refills | Status: DC

## 2016-11-25 NOTE — Progress Notes (Signed)
Patient ID: Samantha Eaton, female   DOB: 06-Jun-1961, 56 y.o.   MRN: [id number] History of Present Illness:  Samantha Eaton is a 56 year old white female in for well woman gyn exam, she is sp hysterectomy. She is Science writer in [place]  PCP is Dr Willey Blade.   Current Medications, Allergies, Past Medical History, Past Surgical History, Family History and Social History were reviewed in [employer] record.     Review of Systems: Patient denies any headaches, hearing loss, blurred vision, shortness of breath, chest pain, abdominal pain, problems with bowel movements, urination. No joint pain or mood swings.Sex is uncomfortable and has not had lately. And she wants to lose weight, is tired at end of the day.    Physical Exam:BP 120/61 (BP Location: Left Arm, Patient Position: Sitting, Cuff Size: Large)   Pulse 66   Ht 5\' 5"  (1.651 m)   Wt 230 lb (104.3 kg)   BMI 38.27 kg/m  General:  Well developed, well nourished, no acute distress Skin:  Warm and dry Neck:  Midline trachea, normal thyroid, good ROM, no lymphadenopathy Lungs; Clear to auscultation bilaterally Breast:  No dominant palpable mass, retraction, or nipple discharge Cardiovascular: Regular rate and rhythm Abdomen:  Soft, non tender, no hepatosplenomegaly Pelvic:  External genitalia is normal in appearance, no lesions.  The vagina is normal in appearance. Urethra has no lesions or masses. The cervix and uterus are absent.  No adnexal masses or tenderness noted.Bladder is non tender, no masses felt. Rectal: Good sphincter tone, no polyps, or hemorrhoids felt.  Hemoccult negative. Extremities/musculoskeletal:  No swelling or varicosities noted, no clubbing or cyanosis, has CT surgery on right and also surgery on 2 finger s by Dr Morene Rankins Psych:  No mood changes, alert and cooperative,seems happy PHQ 2 score 0.  Impression:  1. Well woman exam with routine gynecological exam   2. Essential hypertension   3.  Elevated cholesterol   4. Other specified hypothyroidism   5. RLS (restless legs syndrome)   6. Anxiety   7. Screening for colorectal cancer      Plan: Check CBC,CMP,TSH and lipids, A1c and vitamin D Given 72 gms of estrace cream to use 1 gm in vagina at hs for 2 weeks then 2-3 x weekly and use good lubricate lot [id number] exp 3/20 Physical in 1 year Mammogram yearly Colonoscopy per GI Continue meds, has refills  Try Weight Watchers

## 2016-11-26 LAB — CBC
HEMATOCRIT: 34.7 % (ref 34.0–46.6)
Hemoglobin: 11.4 g/dL (ref 11.1–15.9)
MCH: 30 pg (ref 26.6–33.0)
MCHC: 32.9 g/dL (ref 31.5–35.7)
MCV: 91 fL (ref 79–97)
PLATELETS: 504 10*3/uL — AB (ref 150–379)
RBC: 3.8 x10E6/uL (ref 3.77–5.28)
RDW: 14.5 % (ref 12.3–15.4)
WBC: 12.7 10*3/uL — AB (ref 3.4–10.8)

## 2016-11-26 LAB — COMPREHENSIVE METABOLIC PANEL
ALT: 23 IU/L (ref 0–32)
AST: 21 IU/L (ref 0–40)
Albumin/Globulin Ratio: 1.7 (ref 1.2–2.2)
Albumin: 4.4 g/dL (ref 3.5–5.5)
Alkaline Phosphatase: 69 IU/L (ref 39–117)
BUN/Creatinine Ratio: 25 — ABNORMAL HIGH (ref 9–23)
BUN: 19 mg/dL (ref 6–24)
Bilirubin Total: 0.2 mg/dL (ref 0.0–1.2)
CALCIUM: 9.8 mg/dL (ref 8.7–10.2)
CO2: 24 mmol/L (ref 18–29)
CREATININE: 0.77 mg/dL (ref 0.57–1.00)
Chloride: 101 mmol/L (ref 96–106)
GFR, EST AFRICAN AMERICAN: 101 mL/min/{1.73_m2} (ref >59)
GFR, EST NON AFRICAN AMERICAN: 87 mL/min/{1.73_m2} (ref >59)
Globulin, Total: 2.6 g/dL (ref 1.5–4.5)
Glucose: 88 mg/dL (ref 65–99)
Potassium: 4.8 mmol/L (ref 3.5–5.2)
Sodium: 141 mmol/L (ref 134–144)
TOTAL PROTEIN: 7 g/dL (ref 6.0–8.5)

## 2016-11-26 LAB — LIPID PANEL
CHOLESTEROL TOTAL: 184 mg/dL (ref 100–199)
Chol/HDL Ratio: 4 ratio units (ref 0.0–4.4)
HDL: 46 mg/dL (ref >39)
LDL CALC: 109 mg/dL — AB (ref 0–99)
Triglycerides: 144 mg/dL (ref 0–149)
VLDL CHOLESTEROL CAL: 29 mg/dL (ref 5–40)

## 2016-11-26 LAB — TSH: TSH: 2.89 u[IU]/mL (ref 0.450–4.500)

## 2016-11-26 LAB — VITAMIN D 25 HYDROXY (VIT D DEFICIENCY, FRACTURES): Vit D, 25-Hydroxy: 31 ng/mL (ref 30.0–100.0)

## 2016-11-26 LAB — HEMOGLOBIN A1C
Est. average glucose Bld gHb Est-mCnc: 126 mg/dL
Hgb A1c MFr Bld: 6 % — ABNORMAL HIGH (ref 4.8–5.6)

## 2016-11-27 ENCOUNTER — Telehealth: Payer: Self-pay | Admitting: Adult Health

## 2016-11-27 NOTE — Telephone Encounter (Signed)
Left message with labs results

## 2016-12-22 ENCOUNTER — Other Ambulatory Visit: Payer: Self-pay | Admitting: Adult Health

## 2017-01-06 ENCOUNTER — Other Ambulatory Visit: Payer: Self-pay | Admitting: Adult Health

## 2017-03-05 ENCOUNTER — Other Ambulatory Visit: Payer: Self-pay | Admitting: Adult Health

## 2017-04-02 ENCOUNTER — Other Ambulatory Visit: Payer: Self-pay | Admitting: Adult Health

## 2017-04-02 ENCOUNTER — Other Ambulatory Visit: Payer: Self-pay | Admitting: Women's Health

## 2017-07-07 ENCOUNTER — Other Ambulatory Visit: Payer: Self-pay | Admitting: *Deleted

## 2017-07-07 MED ORDER — PRAMIPEXOLE DIHYDROCHLORIDE 0.5 MG PO TABS: 0.5000 mg | ORAL_TABLET | Freq: Two times a day (BID) | ORAL | 3 refills | Status: DC

## 2017-08-05 ENCOUNTER — Other Ambulatory Visit: Payer: Self-pay | Admitting: Adult Health

## 2017-09-03 ENCOUNTER — Other Ambulatory Visit: Payer: Self-pay | Admitting: Internal Medicine

## 2017-09-03 DIAGNOSIS — Z1231 Encounter for screening mammogram for malignant neoplasm of breast: Secondary | ICD-10-CM

## 2017-09-28 ENCOUNTER — Ambulatory Visit
Admission: RE | Admit: 2017-09-28 | Discharge: 2017-09-28 | Disposition: A | Discharge status: Home / Self Care | Payer: Managed Care, Other (non HMO) | Source: Ambulatory Visit | Attending: Internal Medicine | Admitting: Internal Medicine

## 2017-09-28 DIAGNOSIS — Z1231 Encounter for screening mammogram for malignant neoplasm of breast: Secondary | ICD-10-CM

## 2017-10-23 ENCOUNTER — Encounter (HOSPITAL_COMMUNITY): Payer: Self-pay | Admitting: Emergency Medicine

## 2017-10-23 ENCOUNTER — Other Ambulatory Visit: Payer: Self-pay | Admitting: Obstetrics & Gynecology

## 2017-10-23 ENCOUNTER — Telehealth: Payer: Self-pay | Admitting: Adult Health

## 2017-10-23 ENCOUNTER — Other Ambulatory Visit: Payer: Self-pay

## 2017-10-23 ENCOUNTER — Inpatient Hospital Stay (HOSPITAL_COMMUNITY)
Admission: EM | Admit: 2017-10-23 | Discharge: 2017-10-26 | DRG: 419 | Disposition: A | Discharge status: Home / Self Care | Payer: Managed Care, Other (non HMO) | Attending: Internal Medicine | Admitting: Internal Medicine

## 2017-10-23 ENCOUNTER — Emergency Department (HOSPITAL_COMMUNITY): Payer: Managed Care, Other (non HMO)

## 2017-10-23 DIAGNOSIS — Z8249 Family history of ischemic heart disease and other diseases of the circulatory system: Secondary | ICD-10-CM

## 2017-10-23 DIAGNOSIS — Z9081 Acquired absence of spleen: Secondary | ICD-10-CM

## 2017-10-23 DIAGNOSIS — E039 Hypothyroidism, unspecified: Secondary | ICD-10-CM | POA: Diagnosis present

## 2017-10-23 DIAGNOSIS — D869 Sarcoidosis, unspecified: Secondary | ICD-10-CM | POA: Diagnosis present

## 2017-10-23 DIAGNOSIS — E785 Hyperlipidemia, unspecified: Secondary | ICD-10-CM

## 2017-10-23 DIAGNOSIS — Z825 Family history of asthma and other chronic lower respiratory diseases: Secondary | ICD-10-CM

## 2017-10-23 DIAGNOSIS — K8000 Calculus of gallbladder with acute cholecystitis without obstruction: Secondary | ICD-10-CM | POA: Diagnosis present

## 2017-10-23 DIAGNOSIS — Z90710 Acquired absence of both cervix and uterus: Secondary | ICD-10-CM

## 2017-10-23 DIAGNOSIS — E038 Other specified hypothyroidism: Secondary | ICD-10-CM | POA: Diagnosis not present

## 2017-10-23 DIAGNOSIS — K529 Noninfective gastroenteritis and colitis, unspecified: Secondary | ICD-10-CM | POA: Diagnosis present

## 2017-10-23 DIAGNOSIS — M479 Spondylosis, unspecified: Secondary | ICD-10-CM | POA: Diagnosis present

## 2017-10-23 DIAGNOSIS — G4733 Obstructive sleep apnea (adult) (pediatric): Secondary | ICD-10-CM | POA: Diagnosis present

## 2017-10-23 DIAGNOSIS — M1990 Unspecified osteoarthritis, unspecified site: Secondary | ICD-10-CM | POA: Diagnosis present

## 2017-10-23 DIAGNOSIS — Z79890 Hormone replacement therapy (postmenopausal): Secondary | ICD-10-CM

## 2017-10-23 DIAGNOSIS — G43909 Migraine, unspecified, not intractable, without status migrainosus: Secondary | ICD-10-CM | POA: Diagnosis present

## 2017-10-23 DIAGNOSIS — E876 Hypokalemia: Secondary | ICD-10-CM

## 2017-10-23 DIAGNOSIS — Z791 Long term (current) use of non-steroidal anti-inflammatories (NSAID): Secondary | ICD-10-CM

## 2017-10-23 DIAGNOSIS — E7800 Pure hypercholesterolemia, unspecified: Secondary | ICD-10-CM | POA: Diagnosis present

## 2017-10-23 DIAGNOSIS — F419 Anxiety disorder, unspecified: Secondary | ICD-10-CM | POA: Diagnosis present

## 2017-10-23 DIAGNOSIS — Z833 Family history of diabetes mellitus: Secondary | ICD-10-CM | POA: Diagnosis not present

## 2017-10-23 DIAGNOSIS — M19042 Primary osteoarthritis, left hand: Secondary | ICD-10-CM | POA: Diagnosis present

## 2017-10-23 DIAGNOSIS — I10 Essential (primary) hypertension: Secondary | ICD-10-CM | POA: Diagnosis present

## 2017-10-23 DIAGNOSIS — R51 Headache: Secondary | ICD-10-CM | POA: Diagnosis not present

## 2017-10-23 DIAGNOSIS — Z809 Family history of malignant neoplasm, unspecified: Secondary | ICD-10-CM

## 2017-10-23 DIAGNOSIS — R12 Heartburn: Secondary | ICD-10-CM | POA: Diagnosis not present

## 2017-10-23 DIAGNOSIS — K219 Gastro-esophageal reflux disease without esophagitis: Secondary | ICD-10-CM | POA: Diagnosis present

## 2017-10-23 DIAGNOSIS — E782 Mixed hyperlipidemia: Secondary | ICD-10-CM | POA: Diagnosis not present

## 2017-10-23 DIAGNOSIS — M19041 Primary osteoarthritis, right hand: Secondary | ICD-10-CM | POA: Diagnosis present

## 2017-10-23 DIAGNOSIS — Z01818 Encounter for other preprocedural examination: Secondary | ICD-10-CM

## 2017-10-23 DIAGNOSIS — Z981 Arthrodesis status: Secondary | ICD-10-CM

## 2017-10-23 DIAGNOSIS — G2581 Restless legs syndrome: Secondary | ICD-10-CM | POA: Diagnosis present

## 2017-10-23 DIAGNOSIS — K810 Acute cholecystitis: Secondary | ICD-10-CM | POA: Diagnosis present

## 2017-10-23 DIAGNOSIS — R519 Headache, unspecified: Secondary | ICD-10-CM

## 2017-10-23 LAB — CBC
HCT: 34.6 % — ABNORMAL LOW (ref 36.0–46.0)
Hemoglobin: 11.1 g/dL — ABNORMAL LOW (ref 12.0–15.0)
MCH: 29.8 pg (ref 26.0–34.0)
MCHC: 32.1 g/dL (ref 30.0–36.0)
MCV: 93 fL (ref 78.0–100.0)
Platelets: 591 10*3/uL — ABNORMAL HIGH (ref 150–400)
RBC: 3.72 MIL/uL — ABNORMAL LOW (ref 3.87–5.11)
RDW: 13.7 % (ref 11.5–15.5)
WBC: 17.7 10*3/uL — ABNORMAL HIGH (ref 4.0–10.5)

## 2017-10-23 LAB — URINALYSIS, ROUTINE W REFLEX MICROSCOPIC
Bilirubin Urine: NEGATIVE
Glucose, UA: NEGATIVE mg/dL
Hgb urine dipstick: NEGATIVE
Ketones, ur: NEGATIVE mg/dL
Leukocytes, UA: NEGATIVE
Nitrite: NEGATIVE
Protein, ur: 30 mg/dL — AB
Specific Gravity, Urine: 1.031 — ABNORMAL HIGH (ref 1.005–1.030)
pH: 5 (ref 5.0–8.0)

## 2017-10-23 LAB — COMPREHENSIVE METABOLIC PANEL
ALT: 37 U/L (ref 14–54)
AST: 27 U/L (ref 15–41)
Albumin: 3.5 g/dL (ref 3.5–5.0)
Alkaline Phosphatase: 129 U/L — ABNORMAL HIGH (ref 38–126)
Anion gap: 10 (ref 5–15)
BUN: 21 mg/dL — ABNORMAL HIGH (ref 6–20)
CO2: 26 mmol/L (ref 22–32)
Calcium: 9.7 mg/dL (ref 8.9–10.3)
Chloride: 103 mmol/L (ref 101–111)
Creatinine, Ser: 0.8 mg/dL (ref 0.44–1.00)
GFR calc Af Amer: >60 mL/min (ref >60)
GFR calc non Af Amer: >60 mL/min (ref >60)
Glucose, Bld: 93 mg/dL (ref 65–99)
Potassium: 3.3 mmol/L — ABNORMAL LOW (ref 3.5–5.1)
Sodium: 139 mmol/L (ref 135–145)
Total Bilirubin: 0.7 mg/dL (ref 0.3–1.2)
Total Protein: 8.8 g/dL — ABNORMAL HIGH (ref 6.5–8.1)

## 2017-10-23 LAB — LIPASE, BLOOD: Lipase: 23 U/L (ref 11–51)

## 2017-10-23 MED ORDER — ESCITALOPRAM OXALATE 10 MG PO TABS
10.0000 mg | ORAL_TABLET | Freq: Every day | ORAL | Status: DC
  Administered 2017-10-24 – 2017-10-26 (×3): 10 mg via ORAL
  Filled 2017-10-23 (×3): qty 1

## 2017-10-23 MED ORDER — MORPHINE SULFATE (PF) 4 MG/ML IV SOLN
4.0000 mg | INTRAVENOUS | Status: DC | PRN
  Administered 2017-10-23: 4 mg via INTRAVENOUS
  Filled 2017-10-23: qty 1

## 2017-10-23 MED ORDER — PIPERACILLIN-TAZOBACTAM 3.375 G IVPB 30 MIN
3.3750 g | Freq: Once | INTRAVENOUS | Status: AC
  Administered 2017-10-23: 3.375 g via INTRAVENOUS
  Filled 2017-10-23: qty 50

## 2017-10-23 MED ORDER — ESCITALOPRAM OXALATE 10 MG PO TABS: 10.0000 mg | ORAL_TABLET | Freq: Every day | ORAL | 11 refills | Status: DC

## 2017-10-23 MED ORDER — LEVOTHYROXINE SODIUM 50 MCG PO TABS
50.0000 ug | ORAL_TABLET | Freq: Every day | ORAL | Status: DC
  Administered 2017-10-24 – 2017-10-26 (×3): 50 ug via ORAL
  Filled 2017-10-23 (×3): qty 1

## 2017-10-23 MED ORDER — SENNOSIDES-DOCUSATE SODIUM 8.6-50 MG PO TABS: 1.0000 | ORAL_TABLET | Freq: Every evening | ORAL | Status: DC | PRN

## 2017-10-23 MED ORDER — ACETAMINOPHEN 650 MG RE SUPP: 650.0000 mg | Freq: Four times a day (QID) | RECTAL | Status: DC | PRN

## 2017-10-23 MED ORDER — ONDANSETRON HCL 4 MG/2ML IJ SOLN
4.0000 mg | INTRAMUSCULAR | Status: DC | PRN
  Administered 2017-10-23: 4 mg via INTRAVENOUS
  Filled 2017-10-23: qty 2

## 2017-10-23 MED ORDER — PRAMIPEXOLE DIHYDROCHLORIDE 1 MG PO TABS
0.5000 mg | ORAL_TABLET | Freq: Two times a day (BID) | ORAL | Status: DC
  Administered 2017-10-23 – 2017-10-26 (×6): 0.5 mg via ORAL
  Filled 2017-10-23 (×6): qty 1

## 2017-10-23 MED ORDER — AMLODIPINE BESYLATE 5 MG PO TABS
5.0000 mg | ORAL_TABLET | Freq: Every day | ORAL | Status: DC
  Administered 2017-10-24 – 2017-10-26 (×3): 5 mg via ORAL
  Filled 2017-10-23 (×3): qty 1

## 2017-10-23 MED ORDER — ONDANSETRON HCL 4 MG/2ML IJ SOLN
4.0000 mg | Freq: Four times a day (QID) | INTRAMUSCULAR | Status: DC | PRN
  Filled 2017-10-23: qty 2

## 2017-10-23 MED ORDER — ACETAMINOPHEN 325 MG PO TABS
650.0000 mg | ORAL_TABLET | Freq: Four times a day (QID) | ORAL | Status: DC | PRN
  Administered 2017-10-24: 650 mg via ORAL
  Filled 2017-10-23: qty 2

## 2017-10-23 MED ORDER — MORPHINE SULFATE (PF) 4 MG/ML IV SOLN
4.0000 mg | INTRAVENOUS | Status: AC | PRN
  Administered 2017-10-23: 4 mg via INTRAVENOUS
  Filled 2017-10-23: qty 1

## 2017-10-23 MED ORDER — PIPERACILLIN-TAZOBACTAM 3.375 G IVPB
3.3750 g | Freq: Three times a day (TID) | INTRAVENOUS | Status: DC
  Administered 2017-10-24 – 2017-10-26 (×8): 3.375 g via INTRAVENOUS
  Filled 2017-10-23 (×8): qty 50

## 2017-10-23 MED ORDER — SODIUM CHLORIDE 0.9 % IV BOLUS (SEPSIS)
1000.0000 mL | Freq: Once | INTRAVENOUS | Status: AC
  Administered 2017-10-23: 1000 mL via INTRAVENOUS

## 2017-10-23 MED ORDER — SIMVASTATIN 20 MG PO TABS
20.0000 mg | ORAL_TABLET | Freq: Every day | ORAL | Status: DC
  Administered 2017-10-24 – 2017-10-25 (×2): 20 mg via ORAL
  Filled 2017-10-23 (×2): qty 1

## 2017-10-23 MED ORDER — SODIUM CHLORIDE 0.9 % IV SOLN
INTRAVENOUS | Status: DC
  Administered 2017-10-24 (×2): via INTRAVENOUS

## 2017-10-23 NOTE — ED Triage Notes (Addendum)
Pt reports abd pain, loss of appetite, intermittent fevers since last Saturday. Pt denies emesis/diarrhea within last several days but reports was seen at urgent care and was told on Wednesday pt was "dehydrated and dx with gastroparesis". Dr. Willey Blade sent pt over for CT and abd work up. Pt reports still have gallbladder and appendix.

## 2017-10-23 NOTE — H&P (Signed)
History and Physical  Samantha Eaton [id number] DOB: March 10, 1961 DOA: 10/23/2017  Referring physician: Dr Wilson Singer, ED physician PCP: Asencion Noble, MD  Outpatient Specialists: none  Patient Coming From: home  Chief Complaint: abdominal pain  HPI: Samantha Eaton is a 56 y.o. 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  Patient seen for 3 days of worsening right upper quadrant abdominal pain.  Was seen a couple days ago at walk-in care and diagnosed with gastroenteritis.  Patient told to follow-up if worsening.  Today patient's pain increased.  No peeling or provoking factors.  Pain has become constant, severe, without radiation.  Lacks appetite, some loose stools, intermittent fever.  Emergency Department Course: Ultrasound shows acute cholecystitis with cholelithiasis  Review of Systems:   Pt denies any shortness of breath, dyspnea on exertion, orthopnea, cough, wheezing, palpitations, headache, vision changes, lightheadedness, dizziness, melena, rectal bleeding.  Review of systems are otherwise negative  Past Medical History:  Diagnosis Date  . Anxiety   . Arthritis    back, neck, hands  . Blood dyscrasia    SArcoidosis  . Burning with urination 09/27/2014  . Constipation   . DVT (deep venous thrombosis) (HCC)    right leg, years ago  . Elevated cholesterol 09/19/2013  . GERD (gastroesophageal reflux disease)   . Heartburn 09/19/2013  . Hemorrhoids   . HTN (hypertension)    not on medication  . Hypothyroidism   . Migraines   . Obstructive sleep apnea   . Overweight(278.02)   . PONV (postoperative nausea and vomiting)   . RLS (restless legs syndrome)   . Sarcoidosis    in remission for over ten years   Past Surgical History:  Procedure Laterality Date  . Bilateral tubal ligation    . CARPAL TUNNEL RELEASE Left 06/20/2014   Procedure:  LEFT CARPAL TUNNEL RELEASE;  Surgeon: Daryll Brod, MD;  Location: [facility];  Service: Orthopedics;  Laterality: Left;  . CARPAL TUNNEL RELEASE Right 11/11/2016   Procedure: CARPAL TUNNEL RELEASE, right;  Surgeon: Daryll Brod, MD;  Location: [facility];  Service: Orthopedics;  Laterality: Right;  . CESAREAN SECTION     x 2  . COLONOSCOPY  11/20/2011   Procedure: COLONOSCOPY;  Surgeon: Daneil Dolin, MD;  [facility];  Service: Endoscopy;  Laterality: N/A;  7:30  . DISTAL INTERPHALANGEAL JOINT FUSION Left 06/20/2014   Procedure: DISTAL INTERPHALANGEAL JOINT FUSION LEFT INDEX, MIDDLE FINGERS;  Surgeon: Daryll Brod, MD;  Location: [facility];  Service: Orthopedics;  Laterality: Left;  . DISTAL INTERPHALANGEAL JOINT FUSION Right 11/11/2016   Procedure: DISTAL INTERPHALANGEAL JOINT FUSION right index and ring;  Surgeon: Daryll Brod, MD;  Location: [facility];  Service: Orthopedics;  Laterality: Right;  . Left knee  melanoma excsion    . LYMPH NODE BIOPSY  1992   removed from chest area via ant neck excision for biopsy- sarcoidosis dx  . skin tag removed     perianal, Dr. Glo Herring  . 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 SPLENECTOMY    . TOTAL ABDOMINAL HYSTERECTOMY  2003   Left ovary removed   Social History:  reports that  has never smoked. she has never used smokeless tobacco. She reports that she drinks alcohol. She reports that she does not use drugs. Patient lives at home  No Known Allergies  Family  History  Problem Relation Age of Onset  . Liver disease Maternal Grandmother        ???  . Diabetes Maternal Grandmother   . Heart attack Mother 2       deceased  . Diabetes Mother   . Heart attack Father 6       deceased  . Diabetes Paternal Grandmother   . Diabetes Sister   . Cancer Sister   . Diabetes Sister   . Diabetes Sister   . Other Brother        Black lung  . Heart attack Sister   . Asthma  Son   . Colon cancer Neg Hx   . Breast cancer Neg Hx   . Ovarian cancer Neg Hx       Prior to Admission medications   Medication Sig Start Date End Date Taking? Authorizing Provider  acetaminophen (TYLENOL) 500 MG tablet Take 1,000 mg by mouth every 8 (eight) hours as needed.    [provider]  amLODipine (NORVASC) 5 MG tablet TAKE ONE (1) TABLET BY MOUTH EVERY DAY 11/24/16   Estill Dooms, NP  cetirizine (ZYRTEC) 10 MG tablet Take 10 mg by mouth daily.    [provider]  Cyanocobalamin (VITAMIN B 12 PO) Take by mouth daily.     [provider]  escitalopram (LEXAPRO) 10 MG tablet Take 1 tablet (10 mg total) by mouth daily. 10/23/17   Florian Buff, MD  estradiol (ESTRACE VAGINAL) 0.1 MG/GM vaginal cream Use 1 gm vaginally x 2 weeks then 2-3 x weekly 11/25/16   Estill Dooms, NP  HYDROcodone-acetaminophen (NORCO) 5-325 MG tablet Take 1 tablet by mouth every 6 (six) hours as needed for moderate pain. 11/11/16   Daryll Brod, MD  levothyroxine (SYNTHROID, LEVOTHROID) 50 MCG tablet TAKE ONE (1) TABLET EACH DAY 12/23/16   Derrek Monaco A, NP  meloxicam (MOBIC) 7.5 MG tablet TAKE ONE (1) TABLET BY MOUTH EVERY DAY 08/05/17   Estill Dooms, NP  Multiple Vitamins-Minerals (HAIR SKIN AND NAILS FORMULA) TABS Take by mouth daily.     [provider]  pramipexole (MIRAPEX) 0.5 MG tablet Take 1 tablet (0.5 mg total) by mouth 2 (two) times daily. 07/07/17   Estill Dooms, NP  simvastatin (ZOCOR) 20 MG tablet TAKE ONE (1) TABLET BY MOUTH EVERY DAY 11/24/16   Estill Dooms, NP  valACYclovir (VALTREX) 1000 MG tablet Take 2 then 2 next day for fever blisters prn 10/24/15   Estill Dooms, NP    Physical Exam: BP 128/70 (BP Location: Right Arm)   Pulse 81   Temp 98.4 F (36.9 C) (Oral)   Resp 18   Ht 5\' 5"  (1.651 m)   Wt 98.8 kg (217 lb 13 oz)   SpO2 98%   BMI 36.25 kg/m   General: Middle-aged Caucasian female. Awake and alert and  oriented x3. No acute cardiopulmonary distress.  HEENT: Normocephalic atraumatic.  Right and left ears normal in appearance.  Pupils equal, round, reactive to light. Extraocular muscles are intact. Sclerae anicteric and noninjected.  Moist mucosal membranes. No mucosal lesions.  Neck: Neck supple without lymphadenopathy. No carotid bruits. No masses palpated.  Cardiovascular: Regular rate with normal S1-S2 sounds. No murmurs, rubs, gallops auscultated. No JVD.  Respiratory: Good respiratory effort with no wheezes, rales, rhonchi. Lungs clear to auscultation bilaterally.  No accessory muscle use. Abdomen: Soft, tender in right upper quadrant -positive Murphy sign.  No rebound or guarding. Nondistended. Active  bowel sounds. No masses or hepatosplenomegaly  Skin: No rashes, lesions, or ulcerations.  Dry, warm to touch. 2+ dorsalis pedis and radial pulses. Musculoskeletal: No calf or leg pain. All major joints not erythematous nontender.  No upper or lower joint deformation.  Good ROM.  No contractures  Psychiatric: Intact judgment and insight. Pleasant and cooperative. Neurologic: No focal neurological deficits. Strength is 5/5 and symmetric in upper and lower extremities.  Cranial nerves II through XII are grossly intact.           Labs on Admission: I have personally reviewed following labs and imaging studies  CBC: Recent Labs  Lab 10/23/17 1351  WBC 17.7*  HGB 11.1*  HCT 34.6*  MCV 93.0  PLT 683*   Basic Metabolic Panel: Recent Labs  Lab 10/23/17 1351  NA 139  K 3.3*  CL 103  CO2 26  GLUCOSE 93  BUN 21*  CREATININE 0.80  CALCIUM 9.7   GFR: Estimated Creatinine Clearance: 91.4 mL/min (by C-G formula based on SCr of 0.8 mg/dL). Liver Function Tests: Recent Labs  Lab 10/23/17 1351  AST 27  ALT 37  ALKPHOS 129*  BILITOT 0.7  PROT 8.8*  ALBUMIN 3.5   Recent Labs  Lab 10/23/17 1351  LIPASE 23   No results for input(s): AMMONIA in the last 168 hours. Coagulation  Profile: No results for input(s): INR, PROTIME in the last 168 hours. Cardiac Enzymes: No results for input(s): CKTOTAL, CKMB, CKMBINDEX, TROPONINI in the last 168 hours. BNP (last 3 results) No results for input(s): PROBNP in the last 8760 hours. HbA1C: No results for input(s): HGBA1C in the last 72 hours. CBG: No results for input(s): GLUCAP in the last 168 hours. Lipid Profile: No results for input(s): CHOL, HDL, LDLCALC, TRIG, CHOLHDL, LDLDIRECT in the last 72 hours. Thyroid Function Tests: No results for input(s): TSH, T4TOTAL, FREET4, T3FREE, THYROIDAB in the last 72 hours. Anemia Panel: No results for input(s): VITAMINB12, FOLATE, FERRITIN, TIBC, IRON, RETICCTPCT in the last 72 hours. Urine analysis:    Component Value Date/Time   COLORURINE AMBER (A) 10/23/2017 1303   APPEARANCEUR HAZY (A) 10/23/2017 1303   LABSPEC 1.031 (H) 10/23/2017 1303   PHURINE 5.0 10/23/2017 1303   GLUCOSEU NEGATIVE 10/23/2017 1303   HGBUR NEGATIVE 10/23/2017 1303   BILIRUBINUR NEGATIVE 10/23/2017 1303   KETONESUR NEGATIVE 10/23/2017 1303   PROTEINUR 30 (A) 10/23/2017 1303   UROBILINOGEN 0.2 09/27/2014 1252   NITRITE NEGATIVE 10/23/2017 1303   LEUKOCYTESUR NEGATIVE 10/23/2017 1303   Sepsis Labs: @LABRCNTIP (procalcitonin:4,lacticidven:4) )No results found for this or any previous visit (from the past 240 hour(s)).   Radiological Exams on Admission: US Abdomen Limited  Result Date: 10/23/2017 CLINICAL DATA:  Right upper quadrant pain and fever EXAM: ULTRASOUND ABDOMEN LIMITED RIGHT UPPER QUADRANT COMPARISON:  None. FINDINGS: Gallbladder: Within the gallbladder, there is an echogenic focus which moves and shadows measuring 2.6 cm consistent with cholelithiasis. Gallbladder wall is mildly thickened and mild pericholecystic fluid. Patient is focally tender over the gallbladder. Common bile duct: Diameter: 8 mm, mildly prominent. No biliary duct mass or calculus appreciable. Liver: No focal lesion  identified. Liver echogenicity overall is increased. Portal vein is patent on color Doppler imaging with normal direction of blood flow towards the liver. IMPRESSION: 1.  Findings indicative of acute cholecystitis. 2. Mild prominence of the common bile duct without mass or calculus evident. 3. Increased liver echogenicity, a finding most likely indicative of hepatic steatosis. While no focal liver lesions are evident on  this study, it must be cautioned that the sensitivity of ultrasound for detection of focal liver lesions is diminished in this circumstance. Electronically Signed   By: Lowella Grip III M.D.   On: 10/23/2017 16:44    Assessment/Plan: Principal Problem:   Acute cholecystitis Active Problems:   Hypothyroidism   Essential hypertension   Heartburn    This patient was discussed with the ED physician, including pertinent vitals, physical exam findings, labs, and imaging.  We also discussed care given by the ED provider.  1.  Acute cholecystitis  Admits  General surgery consulted  Zosyn  Clear liquids for now 2.  Hypertension  Continue antihypertensives 3.  Heartburn  Continue gastric protection 4.  Hypothyroidism  Continue levothyroxine   DVT prophylaxis: SCDs secondary to possible surgery Consultants: General surgery Code Status: Full code Family Communication: Husband in the room Disposition Plan: Patient to return home following admission   Mishon Blubaugh [place] [facility] Pager [phone]  If 7PM-7AM, please contact night-coverage [email or web address] Password [email or web address]

## 2017-10-23 NOTE — Progress Notes (Signed)
[facility]  Patient seen and full Consult note to follow. Patient with cholecystitis and symptoms for 7 days. Tender on exam. Leukocytosis (but states always has elevation).  Agree with zosyn for now.  Plan for laparoscopic cholecystectomy after several days of antibiotics given the time course for this patient. Likely OR Monday/ Tuesday pending improvement in pain.  Clear liquids ok. Repeating LFTs in am given CBD 56mm and no stone, but want to make sure T bili does not trend up.  Curlene Labrum, MD [facility] [address] [place], [place] [address] [phone] (office)

## 2017-10-23 NOTE — ED Notes (Signed)
Report to Morgan, RN 

## 2017-10-23 NOTE — ED Notes (Signed)
To US

## 2017-10-23 NOTE — ED Provider Notes (Signed)
[facility] EMERGENCY DEPARTMENT Provider Note   CSN: [id number] Arrival date & time: 10/23/17  1249     History   Chief Complaint Chief Complaint  Patient presents with  . Abdominal Pain    HPI Samantha Eaton is a 56 y.o. female.  HPI   56 year old female with right-sided abdominal pain.  The pain started about 3 days ago and is progressively worsening.  She was having more vague symptoms going back since Saturday though.  Nausea.  Anorexia.  Some loose stools.  Intermittent fevers.  Initially her abdominal pain was intermittent.  It is since become constant since yesterday.  Pain is in the right upper quadrant with radiation into her back.  No appreciable exacerbating relieving factors.  No urinary complaints.  Past Medical History:  Diagnosis Date  . Anxiety   . Arthritis    back, neck, hands  . Blood dyscrasia    SArcoidosis  . Burning with urination 09/27/2014  . Constipation   . DVT (deep venous thrombosis) (HCC)    right leg, years ago  . Elevated cholesterol 09/19/2013  . GERD (gastroesophageal reflux disease)   . Heartburn 09/19/2013  . Hemorrhoids   . HTN (hypertension)    not on medication  . Hypothyroidism   . Migraines   . Obstructive sleep apnea   . Overweight(278.02)   . PONV (postoperative nausea and vomiting)   . RLS (restless legs syndrome)   . Sarcoidosis    in remission for over ten years    Patient Active Problem List   Diagnosis Date Noted  . Body aches 10/24/2015  . Burning with urination 09/27/2014  . Heartburn 09/19/2013  . Elevated cholesterol 09/19/2013  . RLS (restless legs syndrome) 09/19/2013  . Anxiety 09/19/2013  . Hemorrhoid 10/23/2011  . Colon cancer screening 10/23/2011  . SARCOIDOSIS 12/30/2007  . HYPOTHYROIDISM 12/30/2007  . OVERWEIGHT 12/30/2007  . OBSTRUCTIVE SLEEP APNEA 12/30/2007  . HYPERTENSION 12/30/2007    Past Surgical History:  Procedure Laterality Date  . Bilateral tubal ligation    . CARPAL TUNNEL  RELEASE Left 06/20/2014   Procedure: LEFT CARPAL TUNNEL RELEASE;  Surgeon: Daryll Brod, MD;  Location: [place];  Service: Orthopedics;  Laterality: Left;  . CARPAL TUNNEL RELEASE Right 11/11/2016   Procedure: CARPAL TUNNEL RELEASE, right;  Surgeon: Daryll Brod, MD;  Location: [place];  Service: Orthopedics;  Laterality: Right;  . CESAREAN SECTION     x 2  . COLONOSCOPY  11/20/2011   Procedure: COLONOSCOPY;  Surgeon: Daneil Dolin, MD;  [facility];  Service: Endoscopy;  Laterality: N/A;  7:30  . DISTAL INTERPHALANGEAL JOINT FUSION Left 06/20/2014   Procedure: DISTAL INTERPHALANGEAL JOINT FUSION LEFT INDEX, MIDDLE FINGERS;  Surgeon: Daryll Brod, MD;  Location: [place];  Service: Orthopedics;  Laterality: Left;  . DISTAL INTERPHALANGEAL JOINT FUSION Right 11/11/2016   Procedure: DISTAL INTERPHALANGEAL JOINT FUSION right index and ring;  Surgeon: Daryll Brod, MD;  Location: [place];  Service: Orthopedics;  Laterality: Right;  . Left knee  melanoma excsion    . LYMPH NODE BIOPSY  1992   removed from chest area via ant neck excision for biopsy- sarcoidosis dx  . skin tag removed     perianal, Dr. Glo Herring  . 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 SPLENECTOMY    . TOTAL ABDOMINAL HYSTERECTOMY  2003   Left ovary removed    OB History  Gravida Para Term Preterm AB Living   2 2       2    SAB TAB Ectopic Multiple Live Births           2       Home Medications    Prior to Admission medications   Medication Sig Start Date End Date Taking? Authorizing Provider  acetaminophen (TYLENOL) 500 MG tablet Take 1,000 mg by mouth every 8 (eight) hours as needed.    [provider]  amLODipine (NORVASC) 5 MG tablet TAKE ONE (1) TABLET BY MOUTH EVERY DAY 11/24/16   Estill Dooms, NP  cetirizine (ZYRTEC) 10 MG tablet Take 10 mg by mouth daily.    [provider]  Cyanocobalamin (VITAMIN B 12 PO)  Take by mouth daily.     [provider]  escitalopram (LEXAPRO) 10 MG tablet Take 1 tablet (10 mg total) by mouth daily. 10/23/17   Florian Buff, MD  estradiol (ESTRACE VAGINAL) 0.1 MG/GM vaginal cream Use 1 gm vaginally x 2 weeks then 2-3 x weekly 11/25/16   Estill Dooms, NP  HYDROcodone-acetaminophen (NORCO) 5-325 MG tablet Take 1 tablet by mouth every 6 (six) hours as needed for moderate pain. 11/11/16   Daryll Brod, MD  levothyroxine (SYNTHROID, LEVOTHROID) 50 MCG tablet TAKE ONE (1) TABLET EACH DAY 12/23/16   Derrek Monaco A, NP  meloxicam (MOBIC) 7.5 MG tablet TAKE ONE (1) TABLET BY MOUTH EVERY DAY 08/05/17   Estill Dooms, NP  Multiple Vitamins-Minerals (HAIR SKIN AND NAILS FORMULA) TABS Take by mouth daily.     [provider]  pramipexole (MIRAPEX) 0.5 MG tablet Take 1 tablet (0.5 mg total) by mouth 2 (two) times daily. 07/07/17   Estill Dooms, NP  simvastatin (ZOCOR) 20 MG tablet TAKE ONE (1) TABLET BY MOUTH EVERY DAY 11/24/16   Estill Dooms, NP  valACYclovir (VALTREX) 1000 MG tablet Take 2 then 2 next day for fever blisters prn 10/24/15   Estill Dooms, NP    Family History Family History  Problem Relation Age of Onset  . Liver disease Maternal Grandmother        ???  . Diabetes Maternal Grandmother   . Heart attack Mother 55       deceased  . Diabetes Mother   . Heart attack Father 53       deceased  . Diabetes Paternal Grandmother   . Diabetes Sister   . Cancer Sister   . Diabetes Sister   . Diabetes Sister   . Other Brother        Black lung  . Heart attack Sister   . Asthma Son   . Colon cancer Neg Hx   . Breast cancer Neg Hx   . Ovarian cancer Neg Hx     Social History Social History   Tobacco Use  . Smoking status: Never Smoker  . Smokeless tobacco: Never Used  Substance Use Topics  . Alcohol use: Yes    Alcohol/week: 0.0 oz    Comment: rarely- once or twice a year  . Drug use: No     Allergies     Patient has no known allergies.   Review of Systems Review of Systems  All systems reviewed and negative, other than as noted in HPI.  Physical Exam Updated Vital Signs BP (!) 141/78 (BP Location: Right Arm)   Pulse 86   Temp 98.7 F (37.1 C) (Oral)   Resp 18   Ht  5\' 5"  (1.651 m)   Wt 99.3 kg (219 lb)   SpO2 98%   BMI 36.44 kg/m   Physical Exam  Constitutional: She appears well-developed and well-nourished. No distress.  HENT:  Head: Normocephalic and atraumatic.  Eyes: Conjunctivae are normal. Right eye exhibits no discharge. Left eye exhibits no discharge.  Neck: Neck supple.  Cardiovascular: Normal rate, regular rhythm and normal heart sounds. Exam reveals no gallop and no friction rub.  No murmur heard. Pulmonary/Chest: Effort normal and breath sounds normal. No respiratory distress.  Abdominal: Soft. She exhibits no distension. There is tenderness.  RUQ and, to a lesser degree, epigastric tenderness  Musculoskeletal: She exhibits no edema or tenderness.  Neurological: She is alert.  Skin: Skin is warm and dry.  Psychiatric: She has a normal mood and affect. Her behavior is normal. Thought content normal.  Nursing note and vitals reviewed.    ED Treatments / Results  Labs (all labs ordered are listed, but only abnormal results are displayed) Labs Reviewed  COMPREHENSIVE METABOLIC PANEL - Abnormal; Notable for the following components:      Result Value   Potassium 3.3 (*)    BUN 21 (*)    Total Protein 8.8 (*)    Alkaline Phosphatase 129 (*)    All other components within normal limits  CBC - Abnormal; Notable for the following components:   WBC 17.7 (*)    RBC 3.72 (*)    Hemoglobin 11.1 (*)    HCT 34.6 (*)    Platelets 591 (*)    All other components within normal limits  URINALYSIS, ROUTINE W REFLEX MICROSCOPIC - Abnormal; Notable for the following components:   Color, Urine AMBER (*)    APPearance HAZY (*)    Specific Gravity, Urine 1.031 (*)     Protein, ur 30 (*)    Bacteria, UA RARE (*)    Squamous Epithelial / LPF 0-5 (*)    All other components within normal limits  LIPASE, BLOOD    EKG  EKG Interpretation None       Radiology US Abdomen Limited  Result Date: 10/23/2017 CLINICAL DATA:  Right upper quadrant pain and fever EXAM: ULTRASOUND ABDOMEN LIMITED RIGHT UPPER QUADRANT COMPARISON:  None. FINDINGS: Gallbladder: Within the gallbladder, there is an echogenic focus which moves and shadows measuring 2.6 cm consistent with cholelithiasis. Gallbladder wall is mildly thickened and mild pericholecystic fluid. Patient is focally tender over the gallbladder. Common bile duct: Diameter: 8 mm, mildly prominent. No biliary duct mass or calculus appreciable. Liver: No focal lesion identified. Liver echogenicity overall is increased. Portal vein is patent on color Doppler imaging with normal direction of blood flow towards the liver. IMPRESSION: 1.  Findings indicative of acute cholecystitis. 2. Mild prominence of the common bile duct without mass or calculus evident. 3. Increased liver echogenicity, a finding most likely indicative of hepatic steatosis. While no focal liver lesions are evident on this study, it must be cautioned that the sensitivity of ultrasound for detection of focal liver lesions is diminished in this circumstance. Electronically Signed   By: Lowella Grip III M.D.   On: 10/23/2017 16:44    Procedures Procedures (including critical care time)  Medications Ordered in ED Medications  sodium chloride 0.9 % bolus 1,000 mL (not administered)  morphine 4 MG/ML injection 4 mg (not administered)  ondansetron (ZOFRAN) injection 4 mg (not administered)     Initial Impression / Assessment and Plan / ED Course  I have reviewed the triage  vital signs and the nursing notes.  Pertinent labs & imaging results that were available during my care of the patient were reviewed by me and considered in my medical decision making  (see chart for details).  Clinical Course as of Oct 23 1705  Fri Oct 23, 2017  1649 Does have cholecystitis. Antibiotics. Surgery consultation. Medicine admission.   [SK]    Clinical Course User Index [SK] Virgel Manifold, MD    56 year old female with fever and right upper quadrant abdominal pain.  Clinically suspect cholecystitis.  Will obtain RUQ [place]. Treat symptoms.   [place] confirms calculus cholecystitis. CBD a little dilated but LFTs ok. With symptoms for almost a week, will dose zosyn. Surgery Consultation. Admission.     Final Clinical Impressions(s) / ED Diagnoses   Final diagnoses:  Acute cholecystitis    ED Discharge Orders    None       Virgel Manifold, MD 10/24/17 [phone]

## 2017-10-23 NOTE — Progress Notes (Signed)
ANTIBIOTIC CONSULT NOTE - INITIAL  Pharmacy Consult for zosyn Indication: intra abdominal infection  No Known Allergies  Patient Measurements: Height: 5\' 5"  (165.1 cm) Weight: 217 lb 13 oz (98.8 kg) IBW/kg (Calculated) : 57   Vital Signs: Temp: 98.4 F (36.9 C) (12/07 1755) Temp Source: Oral (12/07 1755) BP: 128/70 (12/07 1755) Pulse Rate: 81 (12/07 1755) Intake/Output from previous day: No intake/output data recorded. Intake/Output from this shift: No intake/output data recorded.  Labs: Recent Labs    10/23/17 1351  WBC 17.7*  HGB 11.1*  PLT 591*  CREATININE 0.80   Estimated Creatinine Clearance: 91.4 mL/min (by C-G formula based on SCr of 0.8 mg/dL). No results for input(s): VANCOTROUGH, VANCOPEAK, VANCORANDOM, GENTTROUGH, GENTPEAK, GENTRANDOM, TOBRATROUGH, TOBRAPEAK, TOBRARND, AMIKACINPEAK, AMIKACINTROU, AMIKACIN in the last 72 hours.   Microbiology: No results found for this or any previous visit (from the past 720 hour(s)).  Medical History: Past Medical History:  Diagnosis Date  . Anxiety   . Arthritis    back, neck, hands  . Blood dyscrasia    SArcoidosis  . Burning with urination 09/27/2014  . Constipation   . DVT (deep venous thrombosis) (HCC)    right leg, years ago  . Elevated cholesterol 09/19/2013  . GERD (gastroesophageal reflux disease)   . Heartburn 09/19/2013  . Hemorrhoids   . HTN (hypertension)    not on medication  . Hypothyroidism   . Migraines   . Obstructive sleep apnea   . Overweight(278.02)   . PONV (postoperative nausea and vomiting)   . RLS (restless legs syndrome)   . Sarcoidosis    in remission for over ten years    Medications:  Medications Prior to Admission  Medication Sig Dispense Refill Last Dose  . acetaminophen (TYLENOL) 500 MG tablet Take 1,000 mg by mouth every 8 (eight) hours as needed.   Past Week at Unknown time  . amLODipine (NORVASC) 5 MG tablet TAKE ONE (1) TABLET BY MOUTH EVERY DAY 90 tablet 3 Taking  .  cetirizine (ZYRTEC) 10 MG tablet Take 10 mg by mouth daily.   Taking  . Cyanocobalamin (VITAMIN B 12 PO) Take by mouth daily.    11/10/2016 at Unknown time  . escitalopram (LEXAPRO) 10 MG tablet Take 1 tablet (10 mg total) by mouth daily. 30 tablet 11   . estradiol (ESTRACE VAGINAL) 0.1 MG/GM vaginal cream Use 1 gm vaginally x 2 weeks then 2-3 x weekly 72 g 0   . HYDROcodone-acetaminophen (NORCO) 5-325 MG tablet Take 1 tablet by mouth every 6 (six) hours as needed for moderate pain. 30 tablet 0 Taking  . levothyroxine (SYNTHROID, LEVOTHROID) 50 MCG tablet TAKE ONE (1) TABLET EACH DAY 30 tablet 11   . meloxicam (MOBIC) 7.5 MG tablet TAKE ONE (1) TABLET BY MOUTH EVERY DAY 30 tablet 3   . Multiple Vitamins-Minerals (HAIR SKIN AND NAILS FORMULA) TABS Take by mouth daily.    Taking  . pramipexole (MIRAPEX) 0.5 MG tablet Take 1 tablet (0.5 mg total) by mouth 2 (two) times daily. 180 tablet 3   . simvastatin (ZOCOR) 20 MG tablet TAKE ONE (1) TABLET BY MOUTH EVERY DAY 90 tablet 3 Taking  . valACYclovir (VALTREX) 1000 MG tablet Take 2 then 2 next day for fever blisters prn 30 tablet 1 Taking   Assessment: 56 yo lady to start zosyn for IAI.  One dose of zosyn given earlier today  Goal of Therapy:  Eradication of infection  Plan:  Zosyn 3.375 gm IV q8 hours  F/u cultures and clinical course  Beverlee Nims 10/23/2017,8:22 PM

## 2017-10-23 NOTE — ED Notes (Signed)
From US

## 2017-10-24 ENCOUNTER — Inpatient Hospital Stay (HOSPITAL_COMMUNITY): Payer: Managed Care, Other (non HMO)

## 2017-10-24 DIAGNOSIS — E782 Mixed hyperlipidemia: Secondary | ICD-10-CM

## 2017-10-24 DIAGNOSIS — I10 Essential (primary) hypertension: Secondary | ICD-10-CM

## 2017-10-24 DIAGNOSIS — E038 Other specified hypothyroidism: Secondary | ICD-10-CM

## 2017-10-24 DIAGNOSIS — E876 Hypokalemia: Secondary | ICD-10-CM

## 2017-10-24 DIAGNOSIS — E785 Hyperlipidemia, unspecified: Secondary | ICD-10-CM

## 2017-10-24 LAB — CBC
HCT: 30.1 % — ABNORMAL LOW (ref 36.0–46.0)
HEMOGLOBIN: 9.6 g/dL — AB (ref 12.0–15.0)
MCH: 29.9 pg (ref 26.0–34.0)
MCHC: 31.9 g/dL (ref 30.0–36.0)
MCV: 93.8 fL (ref 78.0–100.0)
Platelets: 601 10*3/uL — ABNORMAL HIGH (ref 150–400)
RBC: 3.21 MIL/uL — AB (ref 3.87–5.11)
RDW: 13.5 % (ref 11.5–15.5)
WBC: 14.4 10*3/uL — ABNORMAL HIGH (ref 4.0–10.5)

## 2017-10-24 LAB — COMPREHENSIVE METABOLIC PANEL
ALBUMIN: 3 g/dL — AB (ref 3.5–5.0)
ALK PHOS: 177 U/L — AB (ref 38–126)
ALT: 49 U/L (ref 14–54)
AST: 50 U/L — AB (ref 15–41)
Anion gap: 10 (ref 5–15)
BUN: 16 mg/dL (ref 6–20)
CALCIUM: 8.7 mg/dL — AB (ref 8.9–10.3)
CO2: 25 mmol/L (ref 22–32)
CREATININE: 0.85 mg/dL (ref 0.44–1.00)
Chloride: 102 mmol/L (ref 101–111)
GFR calc non Af Amer: >60 mL/min (ref >60)
GLUCOSE: 112 mg/dL — AB (ref 65–99)
Potassium: 3.1 mmol/L — ABNORMAL LOW (ref 3.5–5.1)
SODIUM: 137 mmol/L (ref 135–145)
Total Bilirubin: 0.7 mg/dL (ref 0.3–1.2)
Total Protein: 7.5 g/dL (ref 6.5–8.1)

## 2017-10-24 MED ORDER — DIPHENHYDRAMINE HCL 50 MG/ML IJ SOLN
25.0000 mg | Freq: Once | INTRAMUSCULAR | Status: AC
  Administered 2017-10-24: 25 mg via INTRAVENOUS
  Filled 2017-10-24: qty 1

## 2017-10-24 MED ORDER — PROCHLORPERAZINE EDISYLATE 5 MG/ML IJ SOLN
5.0000 mg | Freq: Once | INTRAMUSCULAR | Status: AC
  Administered 2017-10-24: 5 mg via INTRAVENOUS
  Filled 2017-10-24: qty 2

## 2017-10-24 MED ORDER — POTASSIUM CHLORIDE IN NACL 40-0.9 MEQ/L-% IV SOLN
INTRAVENOUS | Status: DC
  Administered 2017-10-24: 100 mL/h via INTRAVENOUS

## 2017-10-24 MED ORDER — POTASSIUM CHLORIDE 10 MEQ/100ML IV SOLN
10.0000 meq | INTRAVENOUS | Status: AC
  Administered 2017-10-24 (×2): 10 meq via INTRAVENOUS
  Filled 2017-10-24 (×2): qty 100

## 2017-10-24 MED ORDER — MORPHINE SULFATE (PF) 2 MG/ML IV SOLN
2.0000 mg | INTRAVENOUS | Status: DC | PRN
  Administered 2017-10-25: 2 mg via INTRAVENOUS
  Filled 2017-10-24: qty 1

## 2017-10-24 MED ORDER — HYDROCODONE-ACETAMINOPHEN 5-325 MG PO TABS
1.0000 | ORAL_TABLET | Freq: Four times a day (QID) | ORAL | Status: DC | PRN
  Administered 2017-10-24 – 2017-10-25 (×3): 1 via ORAL
  Filled 2017-10-24 (×4): qty 1

## 2017-10-24 NOTE — Consult Note (Signed)
[facility] Consult  Reason for Consult: Acute Cholecystitis  Referring Physician:  Dr. Nehemiah Settle  Chief Complaint    Abdominal Pain      Samantha Eaton is a 56 y.o. female.  HPI: Ms. 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 She reports abdominal pain and fevers or the last 7 days. She started having fevers and diarrhea some discomfort starting last Saturday, and this continued to get worse throughout the week. She reported going to the Urgent Care and then to Dr. Ria Comment office, where she was sent to the Ed and found to have acute cholecystitis on [place].  She reports that her husband has been sick with a GI bug, and she thought initially she had something similar but when things did not get better she came to see Dr. Willey Blade.   She has not had a surgery in over 30 years since her splenectomy and says that her sarcoid is in remission. She reports having a blood clot in the past the "dissolved' and did not require anticoagulation.   Past Medical History:  Diagnosis Date  . Anxiety   . Arthritis    back, neck, hands  . Blood dyscrasia    SArcoidosis  . Burning with urination 09/27/2014  . Constipation   . DVT (deep venous thrombosis) (HCC)    right leg, years ago  . Elevated cholesterol 09/19/2013  . GERD (gastroesophageal reflux disease)   . Heartburn 09/19/2013  . Hemorrhoids   . HTN (hypertension)    not on medication  . Hypothyroidism   . Migraines   . Obstructive sleep apnea   . Overweight(278.02)   . PONV (postoperative nausea and vomiting)   . RLS (restless legs syndrome)   . Sarcoidosis    in remission for over ten years    Past Surgical History:  Procedure Laterality Date  . Bilateral tubal ligation    . CARPAL TUNNEL RELEASE Left 06/20/2014   Procedure: LEFT CARPAL TUNNEL RELEASE;  Surgeon: Daryll Brod, MD;   Location: [place];  Service: Orthopedics;  Laterality: Left;  . CARPAL TUNNEL RELEASE Right 11/11/2016   Procedure: CARPAL TUNNEL RELEASE, right;  Surgeon: Daryll Brod, MD;  Location: [place];  Service: Orthopedics;  Laterality: Right;  . CESAREAN SECTION     x 2  . COLONOSCOPY  11/20/2011   Procedure: COLONOSCOPY;  Surgeon: Daneil Dolin, MD;  [facility];  Service: Endoscopy;  Laterality: N/A;  7:30  . DISTAL INTERPHALANGEAL JOINT FUSION Left 06/20/2014   Procedure: DISTAL INTERPHALANGEAL JOINT FUSION LEFT INDEX, MIDDLE FINGERS;  Surgeon: Daryll Brod, MD;  Location: [place];  Service: Orthopedics;  Laterality: Left;  . DISTAL INTERPHALANGEAL JOINT FUSION Right 11/11/2016   Procedure: DISTAL INTERPHALANGEAL JOINT FUSION right index and ring;  Surgeon: Daryll Brod, MD;  Location: [place];  Service: Orthopedics;  Laterality: Right;  . Left knee  melanoma excsion    . LYMPH NODE BIOPSY  1992   removed from chest area via ant neck excision for biopsy- sarcoidosis dx  . skin tag removed     perianal, Dr. Glo Herring  . 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 SPLENECTOMY    . TOTAL ABDOMINAL HYSTERECTOMY  2003   Left ovary removed    Family History  Problem Relation Age of Onset  .  Liver disease Maternal Grandmother        ???  . Diabetes Maternal Grandmother   . Heart attack Mother 60       deceased  . Diabetes Mother   . Heart attack Father 28       deceased  . Diabetes Paternal Grandmother   . Diabetes Sister   . Cancer Sister   . Diabetes Sister   . Diabetes Sister   . Other Brother        Black lung  . Heart attack Sister   . Asthma Son   . Colon cancer Neg Hx   . Breast cancer Neg Hx   . Ovarian cancer Neg Hx     Social History   Tobacco Use  . Smoking status: Never Smoker  . Smokeless tobacco: Never Used  Substance Use Topics  . Alcohol use: Yes    Alcohol/week: 0.0 oz     Comment: rarely- once or twice a year  . Drug use: No    Medications:  I have reviewed the patient's current medications. Prior to Admission:  Medications Prior to Admission  Medication Sig Dispense Refill Last Dose  . acetaminophen (TYLENOL) 500 MG tablet Take 1,000 mg by mouth every 8 (eight) hours as needed.   unknown  . amLODipine (NORVASC) 5 MG tablet TAKE ONE (1) TABLET BY MOUTH EVERY DAY 90 tablet 3 10/23/2017 at Unknown time  . cetirizine (ZYRTEC) 10 MG tablet Take 10 mg by mouth daily.   10/23/2017 at Unknown time  . escitalopram (LEXAPRO) 10 MG tablet Take 1 tablet (10 mg total) by mouth daily. 30 tablet 11 10/23/2017 at Unknown time  . hyoscyamine (LEVSIN SL) 0.125 MG SL tablet Take 1 tablet by mouth every 6 (six) hours.  0 10/23/2017 at Unknown time  . levothyroxine (SYNTHROID, LEVOTHROID) 50 MCG tablet TAKE ONE (1) TABLET EACH DAY 30 tablet 11 10/23/2017 at Unknown time  . meloxicam (MOBIC) 7.5 MG tablet TAKE ONE (1) TABLET BY MOUTH EVERY DAY 30 tablet 3 10/23/2017 at Unknown time  . Multiple Vitamins-Minerals (HAIR SKIN AND NAILS FORMULA) TABS Take 1 tablet by mouth daily.    10/23/2017 at Unknown time  . pramipexole (MIRAPEX) 0.5 MG tablet Take 1 tablet (0.5 mg total) by mouth 2 (two) times daily. 180 tablet 3 10/23/2017 at Unknown time  . simvastatin (ZOCOR) 20 MG tablet TAKE ONE (1) TABLET BY MOUTH EVERY DAY 90 tablet 3 10/23/2017 at Unknown time  . valACYclovir (VALTREX) 1000 MG tablet Take 2 then 2 next day for fever blisters prn (Patient taking differently: Take 1,000 mg by mouth daily as needed (fever blister). Take 2 then 2 next day for fever blisters prn) 30 tablet 1 unknown  . ondansetron (ZOFRAN-ODT) 8 MG disintegrating tablet Take 1 tablet by mouth every 8 (eight) hours as needed for nausea.  0 unknown   Scheduled: . amLODipine  5 mg Oral Daily  . escitalopram  10 mg Oral Daily  . levothyroxine  50 mcg Oral QAC breakfast  . pramipexole  0.5 mg Oral BID  . simvastatin  20  mg Oral q1800   Continuous: . sodium chloride 125 mL/hr at 10/24/17 0600  . piperacillin-tazobactam (ZOSYN)  IV Stopped (10/24/17 1244)   PPJ:[id number] **OR** acetaminophen, HYDROcodone-acetaminophen, ondansetron, senna-docusate  Allergies: No Known Allergies   ROS:  A comprehensive review of systems was negative except for: Gastrointestinal: positive for abdominal pain, diarrhea, nausea and vomiting Allergic/Immunologic: positive for sarcoidosis, leukocytosis chronically  Blood pressure 110/74, pulse  88, temperature 98.9 F (37.2 C), temperature source Oral, resp. rate 18, height '5\' 5"'  (1.651 m), weight 217 lb 13 oz (98.8 kg), SpO2 98 %. Physical Exam  Constitutional: She is oriented to person, place, and time and well-developed, well-nourished, and in no distress.  HENT:  Head: Normocephalic.  Eyes: Pupils are equal, round, and reactive to light.  Cardiovascular: Normal rate and regular rhythm.  Pulmonary/Chest: Breath sounds normal. No respiratory distress.  Abdominal: Soft. She exhibits no distension. There is tenderness in the epigastric area and left upper quadrant. There is no rebound and no guarding.  Musculoskeletal: Normal range of motion.  Neurological: She is alert and oriented to person, place, and time.  Skin: Skin is warm and dry.  Psychiatric: Mood, memory, affect and judgment normal.  Vitals reviewed.   Results: Results for orders placed or performed during the hospital encounter of 10/23/17 (from the past 48 hour(s))  Urinalysis, Routine w reflex microscopic     Status: Abnormal   Collection Time: 10/23/17  1:03 PM  Result Value Ref Range   Color, Urine AMBER (A) YELLOW    Comment: BIOCHEMICALS MAY BE AFFECTED BY COLOR   APPearance HAZY (A) CLEAR   Specific Gravity, Urine 1.031 (H) 1.005 - 1.030   pH 5.0 5.0 - 8.0   Glucose, UA NEGATIVE NEGATIVE mg/dL   Hgb urine dipstick NEGATIVE NEGATIVE   Bilirubin Urine NEGATIVE NEGATIVE   Ketones, ur NEGATIVE  NEGATIVE mg/dL   Protein, ur 30 (A) NEGATIVE mg/dL   Nitrite NEGATIVE NEGATIVE   Leukocytes, UA NEGATIVE NEGATIVE   RBC / HPF 0-5 0 - 5 RBC/hpf   WBC, UA 0-5 0 - 5 WBC/hpf   Bacteria, UA RARE (A) NONE SEEN   Squamous Epithelial / LPF 0-5 (A) NONE SEEN   Mucus PRESENT   Lipase, blood     Status: None   Collection Time: 10/23/17  1:51 PM  Result Value Ref Range   Lipase 23 11 - 51 U/L  Comprehensive metabolic panel     Status: Abnormal   Collection Time: 10/23/17  1:51 PM  Result Value Ref Range   Sodium 139 135 - 145 mmol/L   Potassium 3.3 (L) 3.5 - 5.1 mmol/L   Chloride 103 101 - 111 mmol/L   CO2 26 22 - 32 mmol/L   Glucose, Bld 93 65 - 99 mg/dL   BUN 21 (H) 6 - 20 mg/dL   Creatinine, Ser 0.80 0.44 - 1.00 mg/dL   Calcium 9.7 8.9 - 10.3 mg/dL   Total Protein 8.8 (H) 6.5 - 8.1 g/dL   Albumin 3.5 3.5 - 5.0 g/dL   AST 27 15 - 41 U/L   ALT 37 14 - 54 U/L   Alkaline Phosphatase 129 (H) 38 - 126 U/L   Total Bilirubin 0.7 0.3 - 1.2 mg/dL   GFR calc non Af Amer >60 >60 mL/min   GFR calc Af Amer >60 >60 mL/min    Comment: (NOTE) The eGFR has been calculated using the CKD EPI equation. This calculation has not been validated in all clinical situations. eGFR's persistently <60 mL/min signify possible Chronic Kidney Disease.    Anion gap 10 5 - 15  CBC     Status: Abnormal   Collection Time: 10/23/17  1:51 PM  Result Value Ref Range   WBC 17.7 (H) 4.0 - 10.5 K/uL   RBC 3.72 (L) 3.87 - 5.11 MIL/uL   Hemoglobin 11.1 (L) 12.0 - 15.0 g/dL   HCT 34.6 (L)  36.0 - 46.0 %   MCV 93.0 78.0 - 100.0 fL   MCH 29.8 26.0 - 34.0 pg   MCHC 32.1 30.0 - 36.0 g/dL   RDW 13.7 11.5 - 15.5 %   Platelets 591 (H) 150 - 400 K/uL  CBC     Status: Abnormal   Collection Time: 10/24/17  5:40 AM  Result Value Ref Range   WBC 14.4 (H) 4.0 - 10.5 K/uL   RBC 3.21 (L) 3.87 - 5.11 MIL/uL   Hemoglobin 9.6 (L) 12.0 - 15.0 g/dL   HCT 30.1 (L) 36.0 - 46.0 %   MCV 93.8 78.0 - 100.0 fL   MCH 29.9 26.0 - 34.0 pg    MCHC 31.9 30.0 - 36.0 g/dL   RDW 13.5 11.5 - 15.5 %   Platelets 601 (H) 150 - 400 K/uL  Comprehensive metabolic panel     Status: Abnormal   Collection Time: 10/24/17  5:40 AM  Result Value Ref Range   Sodium 137 135 - 145 mmol/L   Potassium 3.1 (L) 3.5 - 5.1 mmol/L   Chloride 102 101 - 111 mmol/L   CO2 25 22 - 32 mmol/L   Glucose, Bld 112 (H) 65 - 99 mg/dL   BUN 16 6 - 20 mg/dL   Creatinine, Ser 0.85 0.44 - 1.00 mg/dL   Calcium 8.7 (L) 8.9 - 10.3 mg/dL   Total Protein 7.5 6.5 - 8.1 g/dL   Albumin 3.0 (L) 3.5 - 5.0 g/dL   AST 50 (H) 15 - 41 U/L   ALT 49 14 - 54 U/L   Alkaline Phosphatase 177 (H) 38 - 126 U/L   Total Bilirubin 0.7 0.3 - 1.2 mg/dL   GFR calc non Af Amer >60 >60 mL/min   GFR calc Af Amer >60 >60 mL/min    Comment: (NOTE) The eGFR has been calculated using the CKD EPI equation. This calculation has not been validated in all clinical situations. eGFR's persistently <60 mL/min signify possible Chronic Kidney Disease.    Anion gap 10 5 - 15   Personally reviewed- thickened gallbladder, CBD 8 mm but no stone identified  US Abdomen Limited  Result Date: 10/23/2017 CLINICAL DATA:  Right upper quadrant pain and fever EXAM: ULTRASOUND ABDOMEN LIMITED RIGHT UPPER QUADRANT COMPARISON:  None. FINDINGS: Gallbladder: Within the gallbladder, there is an echogenic focus which moves and shadows measuring 2.6 cm consistent with cholelithiasis. Gallbladder wall is mildly thickened and mild pericholecystic fluid. Patient is focally tender over the gallbladder. Common bile duct: Diameter: 8 mm, mildly prominent. No biliary duct mass or calculus appreciable. Liver: No focal lesion identified. Liver echogenicity overall is increased. Portal vein is patent on color Doppler imaging with normal direction of blood flow towards the liver. IMPRESSION: 1.  Findings indicative of acute cholecystitis. 2. Mild prominence of the common bile duct without mass or calculus evident. 3. Increased liver  echogenicity, a finding most likely indicative of hepatic steatosis. While no focal liver lesions are evident on this study, it must be cautioned that the sensitivity of ultrasound for detection of focal liver lesions is diminished in this circumstance. Electronically Signed   By: Lowella Grip III M.D.   On: 10/23/2017 16:44     Assessment & Plan:  AYRIS CARANO is a 56 y.o. female with acute cholecystitis going on for about 7 days. No prior real reported history of any gallbladder attacks/ biliary colic. She has been having fevers and pain. She is very tender on exam. We discussed  the risk and benefits of gallbladder surgery and laparoscopic surgery, and given the time course of her disease process, I think that a period of IVF and antibiotics.   -Continue zosyn -Tolerating clears, can have full liquids as long as not making pain worse  -PRN For pain, added morphine PRN back for severe breakthrough pain  All questions were answered to the satisfaction of the patient.  PLAN: I counseled the patient about the indication, risks and benefits of laparoscopic cholecystectomy.  She understands there is a very small chance for bleeding, infection, injury to normal structures (including common bile duct), conversion to open surgery, persistent symptoms, evolution of postcholecystectomy diarrhea, need for secondary interventions, anesthesia reaction, cardiopulmonary issues and other risks not specifically detailed here. I described the expected recovery, the plan for follow-up and the restrictions during the recovery phase.  All questions were answered.   Myself or Dr. Arnoldo Morale will hope to perform on Monday versus Tuesday, weather permitting. The patient is understanding.    Curlene Labrum  10/23/2017 11:33PM (Late entry)

## 2017-10-24 NOTE — Progress Notes (Signed)
PROGRESS NOTE  NESTA SCATURRO QBH:[id number] DOB: 09/13/1961 DOA: 10/23/2017 PCP: Asencion Noble, MD  Brief History:  56 year old female with a history of hypertension, sarcoidosis, GERD, hypothyroidism, migraine headaches, restless leg syndrome, hyperlipidemia presenting with 3-day history of epigastric and right upper quadrant abdominal pain.  The patient started feeling bad approximately 1 week prior to this admission when she felt like she had URI type symptoms.  Subsequently, she developed fevers up to 102.0 F with nausea.  Her abdominal pain was intermittent initially, but became more constant as the days went on.  She denied any chest pain, shortness breath,hemoptysis, diarrhea, hematochezia, melena dysuria, hematuria.  Initial evaluation revealed WBC 17.7. RUQ ultrasound revealed cholelithiasis with gallbladder wall thickening and mild pericholecystic fluid.  The patient was started on Zosyn, and general surgery was consulted to assist with management.  Assessment/Plan: Acute cholecystitis -appreciate general surgery consult -continue zosyn -continue IVF -case discussed with Dr. Constance Haw -pain control  Intractable headache -likely toxic headache due to acute medical condition -CT brain -IV benadryl and compazine -continue IVF -pt also has component of analgesic withdraw--on chronic meloxicam  Essential Hypertension -continue amlodipine  Hyperlipidemia -continue zocor  Hypothyroidism -continue synthroid  Hypokalemia -replete -all KCl to IVF -check mag    Disposition Plan:   Home in 3-4 days  Family Communication:   Spouse updated at bedside 12/8  Consultants:  General surgery  Code Status:  FULL   DVT Prophylaxis:  SCDs   Procedures: As Listed in Progress Note Above  Antibiotics: Zosyn 12/7>>>    Subjective: Patient states that abdominal pain is improving.  She has some intermittent nausea but this is also improving.  She feels hungry.  She  has a headache that is bifrontal in nature without any visual disturbance or focal extremity weakness.  She denies any dysarthria, fevers, chills, chest pain, shortness breath, vomiting, diarrhea,  dysuria, hematuria.  Objective: Vitals:   10/23/17 1740 10/23/17 1755 10/23/17 2119 10/24/17 0625  BP: (!) 140/93 128/70 108/66 110/74  Pulse: 85 81 87 88  Resp: 18 18 18 18   Temp: 98.2 F (36.8 C) 98.4 F (36.9 C) 99.6 F (37.6 C) 98.9 F (37.2 C)  TempSrc: Oral Oral Oral Oral  SpO2: 97% 98% 99% 98%  Weight:  98.8 kg (217 lb 13 oz)    Height:  5\' 5"  (1.651 m)      Intake/Output Summary (Last 24 hours) at 10/24/2017 1540 Last data filed at 10/24/2017 1220 Gross per 24 hour  Intake 2208.75 ml  Output -  Net 2208.75 ml   Weight change:  Exam:   General:  Pt is alert, follows commands appropriately, not in acute distress  HEENT: No icterus, No thrush, No neck mass, [place]/AT  Cardiovascular: RRR, S1/S2, no rubs, no gallops  Respiratory: CTA bilaterally, no wheezing, no crackles, no rhonchi  Abdomen: Soft/+BS, RUQ tender, non distended, no guarding  Extremities: No edema, No lymphangitis, No petechiae, No rashes, no synovitis   Data Reviewed: I have personally reviewed following labs and imaging studies Basic Metabolic Panel: Recent Labs  Lab 10/23/17 1351 10/24/17 0540  NA 139 137  K 3.3* 3.1*  CL 103 102  CO2 26 25  GLUCOSE 93 112*  BUN 21* 16  CREATININE 0.80 0.85  CALCIUM 9.7 8.7*   Liver Function Tests: Recent Labs  Lab 10/23/17 1351 10/24/17 0540  AST 27 50*  ALT 37 49  ALKPHOS 129* 177*  BILITOT 0.7 0.7  PROT 8.8* 7.5  ALBUMIN 3.5 3.0*   Recent Labs  Lab 10/23/17 1351  LIPASE 23   No results for input(s): AMMONIA in the last 168 hours. Coagulation Profile: No results for input(s): INR, PROTIME in the last 168 hours. CBC: Recent Labs  Lab 10/23/17 1351 10/24/17 0540  WBC 17.7* 14.4*  HGB 11.1* 9.6*  HCT 34.6* 30.1*  MCV 93.0 93.8  PLT 591*  601*   Cardiac Enzymes: No results for input(s): CKTOTAL, CKMB, CKMBINDEX, TROPONINI in the last 168 hours. BNP: Invalid input(s): POCBNP CBG: No results for input(s): GLUCAP in the last 168 hours. HbA1C: No results for input(s): HGBA1C in the last 72 hours. Urine analysis:    Component Value Date/Time   COLORURINE AMBER (A) 10/23/2017 1303   APPEARANCEUR HAZY (A) 10/23/2017 1303   LABSPEC 1.031 (H) 10/23/2017 1303   PHURINE 5.0 10/23/2017 1303   GLUCOSEU NEGATIVE 10/23/2017 1303   HGBUR NEGATIVE 10/23/2017 1303   BILIRUBINUR NEGATIVE 10/23/2017 1303   KETONESUR NEGATIVE 10/23/2017 1303   PROTEINUR 30 (A) 10/23/2017 1303   UROBILINOGEN 0.2 09/27/2014 1252   NITRITE NEGATIVE 10/23/2017 1303   LEUKOCYTESUR NEGATIVE 10/23/2017 1303   Sepsis Labs: @LABRCNTIP (procalcitonin:4,lacticidven:4) )No results found for this or any previous visit (from the past 240 hour(s)).   Scheduled Meds: . amLODipine  5 mg Oral Daily  . escitalopram  10 mg Oral Daily  . levothyroxine  50 mcg Oral QAC breakfast  . pramipexole  0.5 mg Oral BID  . simvastatin  20 mg Oral q1800   Continuous Infusions: . 0.9 % NaCl with KCl 40 mEq / L    . piperacillin-tazobactam (ZOSYN)  IV Stopped (10/24/17 1244)    Procedures/Studies: US Abdomen Limited  Result Date: 10/23/2017 CLINICAL DATA:  Right upper quadrant pain and fever EXAM: ULTRASOUND ABDOMEN LIMITED RIGHT UPPER QUADRANT COMPARISON:  None. FINDINGS: Gallbladder: Within the gallbladder, there is an echogenic focus which moves and shadows measuring 2.6 cm consistent with cholelithiasis. Gallbladder wall is mildly thickened and mild pericholecystic fluid. Patient is focally tender over the gallbladder. Common bile duct: Diameter: 8 mm, mildly prominent. No biliary duct mass or calculus appreciable. Liver: No focal lesion identified. Liver echogenicity overall is increased. Portal vein is patent on color Doppler imaging with normal direction of blood flow  towards the liver. IMPRESSION: 1.  Findings indicative of acute cholecystitis. 2. Mild prominence of the common bile duct without mass or calculus evident. 3. Increased liver echogenicity, a finding most likely indicative of hepatic steatosis. While no focal liver lesions are evident on this study, it must be cautioned that the sensitivity of ultrasound for detection of focal liver lesions is diminished in this circumstance. Electronically Signed   By: Lowella Grip III M.D.   On: 10/23/2017 16:44   Mm Screening Breast Tomo Bilateral  Result Date: 09/28/2017 CLINICAL DATA:  Screening. EXAM: 2D DIGITAL SCREENING BILATERAL MAMMOGRAM WITH CAD AND ADJUNCT TOMO COMPARISON:  Previous exam(s). ACR Breast Density Category a: The breast tissue is almost entirely fatty. FINDINGS: There are no findings suspicious for malignancy. Images were processed with CAD. IMPRESSION: No mammographic evidence of malignancy. A result letter of this screening mammogram will be mailed directly to the patient. RECOMMENDATION: Screening mammogram in one year. (Code:SM-B-01Y) BI-RADS CATEGORY  1: Negative. Electronically Signed   By: Ammie Ferrier M.D.   On: 09/28/2017 16:36    Orson Eva, DO  [facility] Pager [phone]  If 7PM-7AM, please contact night-coverage [email or web address] Password [email or web address] 10/24/2017, 3:40 PM  LOS: 1 day

## 2017-10-24 NOTE — Progress Notes (Addendum)
[facility] Progress Note     Subjective: Pain improved from overnight. Doing fair.   Objective: Vital signs in last 24 hours: Temp:  [98 F (36.7 C)-99.6 F (37.6 C)] 98.9 F (37.2 C) (12/08 0625) Pulse Rate:  [81-88] 88 (12/08 0625) Resp:  [18] 18 (12/08 0625) BP: (108-140)/(66-93) 110/74 (12/08 0625) SpO2:  [97 %-100 %] 98 % (12/08 0625) Weight:  [217 lb 13 oz (98.8 kg)] 217 lb 13 oz (98.8 kg) (12/07 1755) Last BM Date: 10/21/17  Intake/Output from previous day: 12/07 0701 - 12/08 0700 In: 1058.8 [P.O.:240; I.V.:718.8; IV Piggyback:100] Out: -  Intake/Output this shift: Total I/O In: 1150 [P.O.:1150] Out: -   General appearance: alert, cooperative and no distress  Normal work breathing Soft, nondistended, LUQ tenderness improved  Lab Results:  Recent Labs    10/23/17 1351 10/24/17 0540  WBC 17.7* 14.4*  HGB 11.1* 9.6*  HCT 34.6* 30.1*  PLT 591* 601*   BMET Recent Labs    10/23/17 1351 10/24/17 0540  NA 139 137  K 3.3* 3.1*  CL 103 102  CO2 26 25  GLUCOSE 93 112*  BUN 21* 16  CREATININE 0.80 0.85  CALCIUM 9.7 8.7*   PT/INR No results for input(s): LABPROT, INR in the last 72 hours.  Studies/Results: US Abdomen Limited  Result Date: 10/23/2017 CLINICAL DATA:  Right upper quadrant pain and fever EXAM: ULTRASOUND ABDOMEN LIMITED RIGHT UPPER QUADRANT COMPARISON:  None. FINDINGS: Gallbladder: Within the gallbladder, there is an echogenic focus which moves and shadows measuring 2.6 cm consistent with cholelithiasis. Gallbladder wall is mildly thickened and mild pericholecystic fluid. Patient is focally tender over the gallbladder. Common bile duct: Diameter: 8 mm, mildly prominent. No biliary duct mass or calculus appreciable. Liver: No focal lesion identified. Liver echogenicity overall is increased. Portal vein is patent on color Doppler imaging with normal direction of blood flow towards the liver. IMPRESSION: 1.  Findings indicative of  acute cholecystitis. 2. Mild prominence of the common bile duct without mass or calculus evident. 3. Increased liver echogenicity, a finding most likely indicative of hepatic steatosis. While no focal liver lesions are evident on this study, it must be cautioned that the sensitivity of ultrasound for detection of focal liver lesions is diminished in this circumstance. Electronically Signed   By: Lowella Grip III M.D.   On: 10/23/2017 16:44    Anti-infectives: Anti-infectives (From admission, onward)   Start     Dose/Rate Route Frequency Ordered Stop   10/24/17 0100  piperacillin-tazobactam (ZOSYN) IVPB 3.375 g     3.375 g 12.5 mL/hr over 240 Minutes Intravenous Every 8 hours 10/23/17 2021     10/23/17 1700  piperacillin-tazobactam (ZOSYN) IVPB 3.375 g     3.375 g 100 mL/hr over 30 Minutes Intravenous  Once 10/23/17 1647 10/23/17 1742      Assessment/Plan: Samantha Eaton is a 56 yo relatively healthy lade with acute cholecystitis for an extended period of time, going on 7 days. She is less tender today.  -Zosyn continue -Full liquids are ok -PRN for pain -Plan for possible Monday/ Tuesday for laparoscopic cholecystectomy -T bili repeated and continues to be 0.7 and no evidence of choledocholithiasis   Updated Dr. Carles Collet.    LOS: 1 day    Virl Cagey 10/24/2017

## 2017-10-25 ENCOUNTER — Inpatient Hospital Stay (HOSPITAL_COMMUNITY): Payer: Managed Care, Other (non HMO)

## 2017-10-25 LAB — CBC
HEMATOCRIT: 31.4 % — AB (ref 36.0–46.0)
HEMOGLOBIN: 9.9 g/dL — AB (ref 12.0–15.0)
MCH: 29.7 pg (ref 26.0–34.0)
MCHC: 31.5 g/dL (ref 30.0–36.0)
MCV: 94.3 fL (ref 78.0–100.0)
Platelets: 697 10*3/uL — ABNORMAL HIGH (ref 150–400)
RBC: 3.33 MIL/uL — ABNORMAL LOW (ref 3.87–5.11)
RDW: 13.6 % (ref 11.5–15.5)
WBC: 12.5 10*3/uL — ABNORMAL HIGH (ref 4.0–10.5)

## 2017-10-25 LAB — COMPREHENSIVE METABOLIC PANEL
ALBUMIN: 3 g/dL — AB (ref 3.5–5.0)
ALT: 40 U/L (ref 14–54)
ANION GAP: 9 (ref 5–15)
AST: 29 U/L (ref 15–41)
Alkaline Phosphatase: 151 U/L — ABNORMAL HIGH (ref 38–126)
BUN: 9 mg/dL (ref 6–20)
CHLORIDE: 104 mmol/L (ref 101–111)
CO2: 24 mmol/L (ref 22–32)
Calcium: 9.2 mg/dL (ref 8.9–10.3)
Creatinine, Ser: 0.71 mg/dL (ref 0.44–1.00)
GFR calc Af Amer: >60 mL/min (ref >60)
GFR calc non Af Amer: >60 mL/min (ref >60)
GLUCOSE: 103 mg/dL — AB (ref 65–99)
POTASSIUM: 4.1 mmol/L (ref 3.5–5.1)
SODIUM: 137 mmol/L (ref 135–145)
TOTAL PROTEIN: 7.4 g/dL (ref 6.5–8.1)
Total Bilirubin: 0.4 mg/dL (ref 0.3–1.2)

## 2017-10-25 LAB — SURGICAL PCR SCREEN
MRSA, PCR: NEGATIVE
STAPHYLOCOCCUS AUREUS: NEGATIVE

## 2017-10-25 LAB — MAGNESIUM: Magnesium: 2.2 mg/dL (ref 1.7–2.4)

## 2017-10-25 LAB — HIV ANTIBODY (ROUTINE TESTING W REFLEX): HIV Screen 4th Generation wRfx: NONREACTIVE

## 2017-10-25 MED ORDER — PROCHLORPERAZINE EDISYLATE 5 MG/ML IJ SOLN
5.0000 mg | Freq: Once | INTRAMUSCULAR | Status: AC
  Administered 2017-10-25: 5 mg via INTRAVENOUS
  Filled 2017-10-25: qty 2

## 2017-10-25 MED ORDER — DEXTROSE 5 % IV SOLN
2.0000 g | INTRAVENOUS | Status: DC
  Filled 2017-10-25: qty 2

## 2017-10-25 MED ORDER — POTASSIUM CHLORIDE IN NACL 20-0.9 MEQ/L-% IV SOLN
INTRAVENOUS | Status: DC
  Administered 2017-10-25 – 2017-10-26 (×2): via INTRAVENOUS

## 2017-10-25 MED ORDER — CHLORHEXIDINE GLUCONATE CLOTH 2 % EX PADS
6.0000 | MEDICATED_PAD | Freq: Once | CUTANEOUS | Status: AC
  Administered 2017-10-25: 6 via TOPICAL

## 2017-10-25 MED ORDER — DIPHENHYDRAMINE HCL 50 MG/ML IJ SOLN
25.0000 mg | Freq: Once | INTRAMUSCULAR | Status: AC
  Administered 2017-10-25: 25 mg via INTRAVENOUS
  Filled 2017-10-25: qty 1

## 2017-10-25 MED ORDER — CHLORHEXIDINE GLUCONATE CLOTH 2 % EX PADS: 6.0000 | MEDICATED_PAD | Freq: Once | CUTANEOUS | Status: AC

## 2017-10-25 NOTE — Progress Notes (Signed)
[facility] Progress Note     Subjective: Pain improved. Resting well. Seen early this AM due to possible snow later in the AM. Overall improved.  Objective: Vital signs in last 24 hours: Temp:  [98.9 F (37.2 C)-99 F (37.2 C)] 99 F (37.2 C) (12/08 2112) Pulse Rate:  [88] 88 (12/08 2112) Resp:  [18] 18 (12/08 2112) BP: (110)/(62-74) 110/62 (12/08 2112) SpO2:  [95 %-98 %] 95 % (12/08 2112) Last BM Date: 10/21/17  Intake/Output from previous day: 12/08 0701 - 12/09 0700 In: 3167.1 [P.O.:1700; I.V.:1267.1; IV Piggyback:200] Out: -  Intake/Output this shift: No intake/output data recorded.  General appearance: alert, cooperative and no distress Resp: normal work breathing GI: soft, RUQ minimally tender, nondistended   Lab Results:  Recent Labs    10/23/17 1351 10/24/17 0540  WBC 17.7* 14.4*  HGB 11.1* 9.6*  HCT 34.6* 30.1*  PLT 591* 601*   BMET Recent Labs    10/23/17 1351 10/24/17 0540  NA 139 137  K 3.3* 3.1*  CL 103 102  CO2 26 25  GLUCOSE 93 112*  BUN 21* 16  CREATININE 0.80 0.85  CALCIUM 9.7 8.7*   PT/INR No results for input(s): LABPROT, INR in the last 72 hours.  Studies/Results: Ct Head Wo Contrast  Result Date: 10/24/2017 CLINICAL DATA:  Intractable headache EXAM: CT HEAD WITHOUT CONTRAST TECHNIQUE: Contiguous axial images were obtained from the base of the skull through the vertex without intravenous contrast. COMPARISON:  None. FINDINGS: Brain: The ventricles are normal in size and configuration. There is slight invagination of CSF into the sella. There is no intracranial mass, hemorrhage, extra-axial fluid collection, or midline shift. There is minimal small vessel disease in the centra semiovale bilaterally. Elsewhere gray-white compartments appear normal. No evident acute infarct. Vascular: No hyperdense vessel. There is calcification in each carotid siphon and distal vertebral artery. Skull: The bony calvarium appears intact.  Sinuses/Orbits: There is mucosal thickening in several ethmoid air cells bilaterally. Other visualized paranasal sinuses are clear. Orbits appear symmetric bilaterally. Other: Mastoid air cells are clear. IMPRESSION: Minimal periventricular small vessel disease. No acute infarct. No hemorrhage or mass. Foci of arterial vascular calcification noted. There is mucosal thickening in several ethmoid air cells. Electronically Signed   By: Lowella Grip III M.D.   On: 10/24/2017 16:18   US Abdomen Limited  Result Date: 10/23/2017 CLINICAL DATA:  Right upper quadrant pain and fever EXAM: ULTRASOUND ABDOMEN LIMITED RIGHT UPPER QUADRANT COMPARISON:  None. FINDINGS: Gallbladder: Within the gallbladder, there is an echogenic focus which moves and shadows measuring 2.6 cm consistent with cholelithiasis. Gallbladder wall is mildly thickened and mild pericholecystic fluid. Patient is focally tender over the gallbladder. Common bile duct: Diameter: 8 mm, mildly prominent. No biliary duct mass or calculus appreciable. Liver: No focal lesion identified. Liver echogenicity overall is increased. Portal vein is patent on color Doppler imaging with normal direction of blood flow towards the liver. IMPRESSION: 1.  Findings indicative of acute cholecystitis. 2. Mild prominence of the common bile duct without mass or calculus evident. 3. Increased liver echogenicity, a finding most likely indicative of hepatic steatosis. While no focal liver lesions are evident on this study, it must be cautioned that the sensitivity of ultrasound for detection of focal liver lesions is diminished in this circumstance. Electronically Signed   By: Lowella Grip III M.D.   On: 10/23/2017 16:44    Anti-infectives: Anti-infectives (From admission, onward)   Start     Dose/Rate Route Frequency  Ordered Stop   10/24/17 0100  piperacillin-tazobactam (ZOSYN) IVPB 3.375 g     3.375 g 12.5 mL/hr over 240 Minutes Intravenous Every 8 hours 10/23/17  2021     10/23/17 1700  piperacillin-tazobactam (ZOSYN) IVPB 3.375 g     3.375 g 100 mL/hr over 30 Minutes Intravenous  Once 10/23/17 1647 10/23/17 1742      Assessment/Plan: Ms. Dena is a 56 yo with acute cholecystitis with symptoms for 7 days prior to her admission. Started on IV antibiotics and overall pain improved and leukocytosis down. -Plan for OR Monday for Lap chole with myself or Dr. Arnoldo Morale pending the weather versus Tuesday -NPO at midnight -EKG and CXR ordered for later this AM   LOS: 2 days    Virl Cagey 10/25/2017

## 2017-10-25 NOTE — Progress Notes (Signed)
PROGRESS NOTE  Samantha Eaton TDD:[id number] DOB: 11-29-1960 DOA: 10/23/2017 PCP: Asencion Noble, MD  Brief History:  56 year old female with a history of hypertension, sarcoidosis, GERD, hypothyroidism, migraine headaches, restless leg syndrome, hyperlipidemia presenting with 3-day history of epigastric and right upper quadrant abdominal pain.  The patient started feeling bad approximately 1 week prior to this admission when she felt like she had URI type symptoms.  Subsequently, she developed fevers up to 102.0 F with nausea.  Her abdominal pain was intermittent initially, but became more constant as the days went on.  She denied any chest pain, shortness breath,hemoptysis, diarrhea, hematochezia, melena dysuria, hematuria.  Initial evaluation revealed WBC 17.7. RUQ ultrasound revealed cholelithiasis with gallbladder wall thickening and mild pericholecystic fluid.  The patient was started on Zosyn, and general surgery was consulted to assist with management.  Assessment/Plan: Acute cholecystitis -appreciate general surgery consult -continue zosyn -continue IVF--add KCl to fluids -case discussed with Dr. Constance Haw -pain control -plan for cholecystectomy 12/10 if weather permits surgical team to arrive -personally reviewed EKG--NSR -personally reviewed CXR--no edema, no infiltrates, bibasilar atelectasis -am CBC  Intractable headache -likely toxic headache due to acute medical condition -CT brain--neg for acute findings -IV benadryl and compazine--repeat -continue IVF -pt also has component of analgesic withdraw--on chronic meloxicam -outpatient neurology referral to neurology from chronic daily headache, migraine  Essential Hypertension -continue amlodipine   Hyperlipidemia -continue zocor  Hypothyroidism -continue synthroid  Hypokalemia -replete -all KCl to IVF -check mag--2.2    Disposition Plan:   Home in 2-3 days pending choleycystectomy Family  Communication:   Spouse updated at bedside 12/9  Consultants:  General surgery  Code Status:  FULL   DVT Prophylaxis:  SCDs   Procedures: As Listed in Progress Note Above  Antibiotics: Zosyn 12/7>>>     Subjective: Patient says that her headache is a little better with intravenous Benadryl and Compazine, but is returning today.  She denies any focal extremity weakness, visual disturbance, dysarthria.  Her abdominal pain is gradually improving.  She denies any vomiting, diarrhea, dysuria, hematuria patient has some nausea.  Objective: Vitals:   10/23/17 2119 10/24/17 0625 10/24/17 2112 10/25/17 0515  BP: 108/66 110/74 110/62 139/85  Pulse: 87 88 88 77  Resp: 18 18 18 18   Temp: 99.6 F (37.6 C) 98.9 F (37.2 C) 99 F (37.2 C) 97.6 F (36.4 C)  TempSrc: Oral Oral Oral Oral  SpO2: 99% 98% 95% 97%  Weight:      Height:        Intake/Output Summary (Last 24 hours) at 10/25/2017 1203 Last data filed at 10/25/2017 0855 Gross per 24 hour  Intake 4652.09 ml  Output -  Net 4652.09 ml   Weight change:  Exam:   General:  Pt is alert, follows commands appropriately, not in acute distress  HEENT: No icterus, No thrush, No neck mass, [place]/AT  Cardiovascular: RRR, S1/S2, no rubs, no gallops  Respiratory: CTA bilaterally, no wheezing, no crackles, no rhonchi  Abdomen: Soft/+BS, mild RUQ tender, non distended, no guarding  Extremities: 1 + LE edema, No lymphangitis, No petechiae, No rashes, no synovitis   Data Reviewed: I have personally reviewed following labs and imaging studies Basic Metabolic Panel: Recent Labs  Lab 10/23/17 1351 10/24/17 0540 10/25/17 0545  NA 139 137 137  K 3.3* 3.1* 4.1  CL 103 102 104  CO2 26 25 24   GLUCOSE 93 112* 103*  BUN 21* 16 9  CREATININE 0.80 0.85 0.71  CALCIUM 9.7 8.7* 9.2  MG  --   --  2.2   Liver Function Tests: Recent Labs  Lab 10/23/17 1351 10/24/17 0540 10/25/17 0545  AST 27 50* 29  ALT 37 49 40  ALKPHOS  129* 177* 151*  BILITOT 0.7 0.7 0.4  PROT 8.8* 7.5 7.4  ALBUMIN 3.5 3.0* 3.0*   Recent Labs  Lab 10/23/17 1351  LIPASE 23   No results for input(s): AMMONIA in the last 168 hours. Coagulation Profile: No results for input(s): INR, PROTIME in the last 168 hours. CBC: Recent Labs  Lab 10/23/17 1351 10/24/17 0540 10/25/17 0545  WBC 17.7* 14.4* 12.5*  HGB 11.1* 9.6* 9.9*  HCT 34.6* 30.1* 31.4*  MCV 93.0 93.8 94.3  PLT 591* 601* 697*   Cardiac Enzymes: No results for input(s): CKTOTAL, CKMB, CKMBINDEX, TROPONINI in the last 168 hours. BNP: Invalid input(s): POCBNP CBG: No results for input(s): GLUCAP in the last 168 hours. HbA1C: No results for input(s): HGBA1C in the last 72 hours. Urine analysis:    Component Value Date/Time   COLORURINE AMBER (A) 10/23/2017 1303   APPEARANCEUR HAZY (A) 10/23/2017 1303   LABSPEC 1.031 (H) 10/23/2017 1303   PHURINE 5.0 10/23/2017 1303   GLUCOSEU NEGATIVE 10/23/2017 1303   HGBUR NEGATIVE 10/23/2017 1303   BILIRUBINUR NEGATIVE 10/23/2017 1303   KETONESUR NEGATIVE 10/23/2017 1303   PROTEINUR 30 (A) 10/23/2017 1303   UROBILINOGEN 0.2 09/27/2014 1252   NITRITE NEGATIVE 10/23/2017 1303   LEUKOCYTESUR NEGATIVE 10/23/2017 1303   Sepsis Labs: @LABRCNTIP (procalcitonin:4,lacticidven:4) )No results found for this or any previous visit (from the past 240 hour(s)).   Scheduled Meds: . amLODipine  5 mg Oral Daily  . escitalopram  10 mg Oral Daily  . levothyroxine  50 mcg Oral QAC breakfast  . pramipexole  0.5 mg Oral BID  . simvastatin  20 mg Oral q1800   Continuous Infusions: . 0.9 % NaCl with KCl 20 mEq / L    . piperacillin-tazobactam (ZOSYN)  IV 3.375 g (10/25/17 0903)    Procedures/Studies: Ct Head Wo Contrast  Result Date: 10/24/2017 CLINICAL DATA:  Intractable headache EXAM: CT HEAD WITHOUT CONTRAST TECHNIQUE: Contiguous axial images were obtained from the base of the skull through the vertex without intravenous contrast.  COMPARISON:  None. FINDINGS: Brain: The ventricles are normal in size and configuration. There is slight invagination of CSF into the sella. There is no intracranial mass, hemorrhage, extra-axial fluid collection, or midline shift. There is minimal small vessel disease in the centra semiovale bilaterally. Elsewhere gray-white compartments appear normal. No evident acute infarct. Vascular: No hyperdense vessel. There is calcification in each carotid siphon and distal vertebral artery. Skull: The bony calvarium appears intact. Sinuses/Orbits: There is mucosal thickening in several ethmoid air cells bilaterally. Other visualized paranasal sinuses are clear. Orbits appear symmetric bilaterally. Other: Mastoid air cells are clear. IMPRESSION: Minimal periventricular small vessel disease. No acute infarct. No hemorrhage or mass. Foci of arterial vascular calcification noted. There is mucosal thickening in several ethmoid air cells. Electronically Signed   By: Lowella Grip III M.D.   On: 10/24/2017 16:18   US Abdomen Limited  Result Date: 10/23/2017 CLINICAL DATA:  Right upper quadrant pain and fever EXAM: ULTRASOUND ABDOMEN LIMITED RIGHT UPPER QUADRANT COMPARISON:  None. FINDINGS: Gallbladder: Within the gallbladder, there is an echogenic focus which moves and shadows measuring 2.6 cm consistent with cholelithiasis. Gallbladder wall is mildly thickened and mild pericholecystic fluid. Patient is focally tender over  the gallbladder. Common bile duct: Diameter: 8 mm, mildly prominent. No biliary duct mass or calculus appreciable. Liver: No focal lesion identified. Liver echogenicity overall is increased. Portal vein is patent on color Doppler imaging with normal direction of blood flow towards the liver. IMPRESSION: 1.  Findings indicative of acute cholecystitis. 2. Mild prominence of the common bile duct without mass or calculus evident. 3. Increased liver echogenicity, a finding most likely indicative of hepatic  steatosis. While no focal liver lesions are evident on this study, it must be cautioned that the sensitivity of ultrasound for detection of focal liver lesions is diminished in this circumstance. Electronically Signed   By: Lowella Grip III M.D.   On: 10/23/2017 16:44   Dg Chest Port 1 View  Result Date: 10/25/2017 CLINICAL DATA:  Preoperative exam for gallbladder surgery. EXAM: PORTABLE CHEST 1 VIEW COMPARISON:  None. FINDINGS: Heart size is normal. Mediastinal shadows are normal. Left lung is clear. There is mild atelectasis or scarring in the right perihilar region. No visible effusion. Bony structures unremarkable. IMPRESSION: Mild scarring or atelectasis in the right perihilar region. Electronically Signed   By: Nelson Chimes M.D.   On: 10/25/2017 07:30   Mm Screening Breast Tomo Bilateral  Result Date: 09/28/2017 CLINICAL DATA:  Screening. EXAM: 2D DIGITAL SCREENING BILATERAL MAMMOGRAM WITH CAD AND ADJUNCT TOMO COMPARISON:  Previous exam(s). ACR Breast Density Category a: The breast tissue is almost entirely fatty. FINDINGS: There are no findings suspicious for malignancy. Images were processed with CAD. IMPRESSION: No mammographic evidence of malignancy. A result letter of this screening mammogram will be mailed directly to the patient. RECOMMENDATION: Screening mammogram in one year. (Code:SM-B-01Y) BI-RADS CATEGORY  1: Negative. Electronically Signed   By: Ammie Ferrier M.D.   On: 09/28/2017 16:36    Orson Eva, DO  [facility] Pager [phone]  If 7PM-7AM, please contact night-coverage [email or web address] Password [email or web address] 10/25/2017, 12:03 PM   LOS: 2 days

## 2017-10-25 NOTE — H&P (View-Only) (Signed)
[facility] Progress Note     Subjective: Pain improved. Resting well. Seen early this AM due to possible snow later in the AM. Overall improved.  Objective: Vital signs in last 24 hours: Temp:  [98.9 F (37.2 C)-99 F (37.2 C)] 99 F (37.2 C) (12/08 2112) Pulse Rate:  [88] 88 (12/08 2112) Resp:  [18] 18 (12/08 2112) BP: (110)/(62-74) 110/62 (12/08 2112) SpO2:  [95 %-98 %] 95 % (12/08 2112) Last BM Date: 10/21/17  Intake/Output from previous day: 12/08 0701 - 12/09 0700 In: 3167.1 [P.O.:1700; I.V.:1267.1; IV Piggyback:200] Out: -  Intake/Output this shift: No intake/output data recorded.  General appearance: alert, cooperative and no distress Resp: normal work breathing GI: soft, RUQ minimally tender, nondistended   Lab Results:  Recent Labs    10/23/17 1351 10/24/17 0540  WBC 17.7* 14.4*  HGB 11.1* 9.6*  HCT 34.6* 30.1*  PLT 591* 601*   BMET Recent Labs    10/23/17 1351 10/24/17 0540  NA 139 137  K 3.3* 3.1*  CL 103 102  CO2 26 25  GLUCOSE 93 112*  BUN 21* 16  CREATININE 0.80 0.85  CALCIUM 9.7 8.7*   PT/INR No results for input(s): LABPROT, INR in the last 72 hours.  Studies/Results: Ct Head Wo Contrast  Result Date: 10/24/2017 CLINICAL DATA:  Intractable headache EXAM: CT HEAD WITHOUT CONTRAST TECHNIQUE: Contiguous axial images were obtained from the base of the skull through the vertex without intravenous contrast. COMPARISON:  None. FINDINGS: Brain: The ventricles are normal in size and configuration. There is slight invagination of CSF into the sella. There is no intracranial mass, hemorrhage, extra-axial fluid collection, or midline shift. There is minimal small vessel disease in the centra semiovale bilaterally. Elsewhere gray-white compartments appear normal. No evident acute infarct. Vascular: No hyperdense vessel. There is calcification in each carotid siphon and distal vertebral artery. Skull: The bony calvarium appears intact.  Sinuses/Orbits: There is mucosal thickening in several ethmoid air cells bilaterally. Other visualized paranasal sinuses are clear. Orbits appear symmetric bilaterally. Other: Mastoid air cells are clear. IMPRESSION: Minimal periventricular small vessel disease. No acute infarct. No hemorrhage or mass. Foci of arterial vascular calcification noted. There is mucosal thickening in several ethmoid air cells. Electronically Signed   By: Lowella Grip III M.D.   On: 10/24/2017 16:18   US Abdomen Limited  Result Date: 10/23/2017 CLINICAL DATA:  Right upper quadrant pain and fever EXAM: ULTRASOUND ABDOMEN LIMITED RIGHT UPPER QUADRANT COMPARISON:  None. FINDINGS: Gallbladder: Within the gallbladder, there is an echogenic focus which moves and shadows measuring 2.6 cm consistent with cholelithiasis. Gallbladder wall is mildly thickened and mild pericholecystic fluid. Patient is focally tender over the gallbladder. Common bile duct: Diameter: 8 mm, mildly prominent. No biliary duct mass or calculus appreciable. Liver: No focal lesion identified. Liver echogenicity overall is increased. Portal vein is patent on color Doppler imaging with normal direction of blood flow towards the liver. IMPRESSION: 1.  Findings indicative of acute cholecystitis. 2. Mild prominence of the common bile duct without mass or calculus evident. 3. Increased liver echogenicity, a finding most likely indicative of hepatic steatosis. While no focal liver lesions are evident on this study, it must be cautioned that the sensitivity of ultrasound for detection of focal liver lesions is diminished in this circumstance. Electronically Signed   By: Lowella Grip III M.D.   On: 10/23/2017 16:44    Anti-infectives: Anti-infectives (From admission, onward)   Start     Dose/Rate Route Frequency  Ordered Stop   10/24/17 0100  piperacillin-tazobactam (ZOSYN) IVPB 3.375 g     3.375 g 12.5 mL/hr over 240 Minutes Intravenous Every 8 hours 10/23/17  2021     10/23/17 1700  piperacillin-tazobactam (ZOSYN) IVPB 3.375 g     3.375 g 100 mL/hr over 30 Minutes Intravenous  Once 10/23/17 1647 10/23/17 1742      Assessment/Plan: Ms. Steinkamp is a 56 yo with acute cholecystitis with symptoms for 7 days prior to her admission. Started on IV antibiotics and overall pain improved and leukocytosis down. -Plan for OR Monday for Lap chole with myself or Dr. Arnoldo Morale pending the weather versus Tuesday -NPO at midnight -EKG and CXR ordered for later this AM   LOS: 2 days    Virl Cagey 10/25/2017

## 2017-10-25 NOTE — Plan of Care (Signed)
Pt is progressing 

## 2017-10-26 ENCOUNTER — Inpatient Hospital Stay (HOSPITAL_COMMUNITY): Payer: Managed Care, Other (non HMO) | Admitting: Anesthesiology

## 2017-10-26 ENCOUNTER — Encounter (HOSPITAL_COMMUNITY)
Admission: EM | Disposition: A | Discharge status: Home / Self Care | Payer: Self-pay | Source: Home / Self Care | Attending: Internal Medicine

## 2017-10-26 DIAGNOSIS — R51 Headache: Secondary | ICD-10-CM

## 2017-10-26 DIAGNOSIS — R519 Headache, unspecified: Secondary | ICD-10-CM

## 2017-10-26 HISTORY — PX: CHOLECYSTECTOMY: SHX55

## 2017-10-26 LAB — CBC
HEMATOCRIT: 31.3 % — AB (ref 36.0–46.0)
Hemoglobin: 9.9 g/dL — ABNORMAL LOW (ref 12.0–15.0)
MCH: 29.7 pg (ref 26.0–34.0)
MCHC: 31.6 g/dL (ref 30.0–36.0)
MCV: 94 fL (ref 78.0–100.0)
PLATELETS: 796 10*3/uL — AB (ref 150–400)
RBC: 3.33 MIL/uL — AB (ref 3.87–5.11)
RDW: 13.5 % (ref 11.5–15.5)
WBC: 13.5 10*3/uL — ABNORMAL HIGH (ref 4.0–10.5)

## 2017-10-26 SURGERY — LAPAROSCOPIC CHOLECYSTECTOMY
Anesthesia: General

## 2017-10-26 MED ORDER — SUCCINYLCHOLINE CHLORIDE 20 MG/ML IJ SOLN
INTRAMUSCULAR | Status: DC | PRN
  Administered 2017-10-26: 170 mg via INTRAVENOUS

## 2017-10-26 MED ORDER — SCOPOLAMINE 1 MG/3DAYS TD PT72
1.0000 | MEDICATED_PATCH | Freq: Once | TRANSDERMAL | Status: DC
  Administered 2017-10-26: 1.5 mg via TRANSDERMAL

## 2017-10-26 MED ORDER — DEXAMETHASONE SODIUM PHOSPHATE 4 MG/ML IJ SOLN
4.0000 mg | Freq: Once | INTRAMUSCULAR | Status: AC
  Administered 2017-10-26: 4 mg via INTRAVENOUS

## 2017-10-26 MED ORDER — LACTATED RINGERS IV SOLN
INTRAVENOUS | Status: DC
  Administered 2017-10-26: 10:00:00 via INTRAVENOUS

## 2017-10-26 MED ORDER — BUPIVACAINE HCL (PF) 0.5 % IJ SOLN
INTRAMUSCULAR | Status: AC
  Filled 2017-10-26: qty 30

## 2017-10-26 MED ORDER — OXYCODONE-ACETAMINOPHEN 5-325 MG PO TABS: 1.0000 | ORAL_TABLET | ORAL | 0 refills | Status: DC | PRN

## 2017-10-26 MED ORDER — BUPIVACAINE HCL (PF) 0.5 % IJ SOLN
INTRAMUSCULAR | Status: DC | PRN
  Administered 2017-10-26: 13 mL

## 2017-10-26 MED ORDER — POVIDONE-IODINE 10 % OINT PACKET
TOPICAL_OINTMENT | CUTANEOUS | Status: DC | PRN
  Administered 2017-10-26: 1 via TOPICAL

## 2017-10-26 MED ORDER — ROCURONIUM BROMIDE 100 MG/10ML IV SOLN
INTRAVENOUS | Status: DC | PRN
  Administered 2017-10-26: 25 mg via INTRAVENOUS
  Administered 2017-10-26: 5 mg via INTRAVENOUS

## 2017-10-26 MED ORDER — FENTANYL CITRATE (PF) 100 MCG/2ML IJ SOLN
25.0000 ug | INTRAMUSCULAR | Status: DC | PRN
  Administered 2017-10-26 (×3): 50 ug via INTRAVENOUS
  Filled 2017-10-26 (×2): qty 2

## 2017-10-26 MED ORDER — SODIUM CHLORIDE 0.9 % IJ SOLN
INTRAMUSCULAR | Status: AC
  Filled 2017-10-26: qty 10

## 2017-10-26 MED ORDER — HEMOSTATIC AGENTS (NO CHARGE) OPTIME
TOPICAL | Status: DC | PRN
  Administered 2017-10-26 (×2): 1 via TOPICAL

## 2017-10-26 MED ORDER — LIDOCAINE HCL (PF) 1 % IJ SOLN
INTRAMUSCULAR | Status: AC
  Filled 2017-10-26: qty 5

## 2017-10-26 MED ORDER — PROPOFOL 10 MG/ML IV BOLUS
INTRAVENOUS | Status: AC
  Filled 2017-10-26: qty 40

## 2017-10-26 MED ORDER — SIMETHICONE 80 MG PO CHEW: 40.0000 mg | CHEWABLE_TABLET | Freq: Four times a day (QID) | ORAL | Status: DC | PRN

## 2017-10-26 MED ORDER — OXYCODONE-ACETAMINOPHEN 5-325 MG PO TABS
1.0000 | ORAL_TABLET | ORAL | Status: DC | PRN
  Administered 2017-10-26: 1 via ORAL
  Filled 2017-10-26: qty 1

## 2017-10-26 MED ORDER — ENOXAPARIN SODIUM 40 MG/0.4ML ~~LOC~~ SOLN: 40.0000 mg | SUBCUTANEOUS | Status: DC

## 2017-10-26 MED ORDER — LIDOCAINE HCL 1 % IJ SOLN
INTRAMUSCULAR | Status: DC | PRN
  Administered 2017-10-26: 30 mg via INTRADERMAL

## 2017-10-26 MED ORDER — ONDANSETRON HCL 4 MG/2ML IJ SOLN
4.0000 mg | Freq: Once | INTRAMUSCULAR | Status: AC
  Administered 2017-10-26: 4 mg via INTRAVENOUS

## 2017-10-26 MED ORDER — SUGAMMADEX SODIUM 200 MG/2ML IV SOLN
INTRAVENOUS | Status: DC | PRN
  Administered 2017-10-26: 200 mg via INTRAVENOUS

## 2017-10-26 MED ORDER — CHLORHEXIDINE GLUCONATE CLOTH 2 % EX PADS
6.0000 | MEDICATED_PAD | Freq: Every day | CUTANEOUS | Status: AC
  Administered 2017-10-26: 6 via TOPICAL

## 2017-10-26 MED ORDER — SUCCINYLCHOLINE CHLORIDE 20 MG/ML IJ SOLN
INTRAMUSCULAR | Status: AC
  Filled 2017-10-26: qty 1

## 2017-10-26 MED ORDER — CIPROFLOXACIN HCL 500 MG PO TABS: 500.0000 mg | ORAL_TABLET | Freq: Two times a day (BID) | ORAL | 0 refills | Status: DC

## 2017-10-26 MED ORDER — CEFOTETAN DISODIUM-DEXTROSE 2-2.08 GM-%(50ML) IV SOLR
INTRAVENOUS | Status: AC
  Filled 2017-10-26: qty 50

## 2017-10-26 MED ORDER — DEXAMETHASONE SODIUM PHOSPHATE 4 MG/ML IJ SOLN
INTRAMUSCULAR | Status: AC
  Filled 2017-10-26: qty 1

## 2017-10-26 MED ORDER — KETOROLAC TROMETHAMINE 30 MG/ML IJ SOLN
30.0000 mg | Freq: Once | INTRAMUSCULAR | Status: AC
  Administered 2017-10-26: 30 mg via INTRAVENOUS
  Filled 2017-10-26: qty 1

## 2017-10-26 MED ORDER — SODIUM CHLORIDE 0.9 % IR SOLN
Status: DC | PRN
  Administered 2017-10-26: 1000 mL
  Administered 2017-10-26: 3000 mL

## 2017-10-26 MED ORDER — PROPOFOL 10 MG/ML IV BOLUS
INTRAVENOUS | Status: DC | PRN
  Administered 2017-10-26: 150 mg via INTRAVENOUS

## 2017-10-26 MED ORDER — MIDAZOLAM HCL 2 MG/2ML IJ SOLN
1.0000 mg | INTRAMUSCULAR | Status: AC
  Administered 2017-10-26 (×2): 2 mg via INTRAVENOUS
  Filled 2017-10-26: qty 2

## 2017-10-26 MED ORDER — MIDAZOLAM HCL 2 MG/2ML IJ SOLN
INTRAMUSCULAR | Status: AC
  Filled 2017-10-26: qty 2

## 2017-10-26 MED ORDER — SUGAMMADEX SODIUM 200 MG/2ML IV SOLN
INTRAVENOUS | Status: AC
  Filled 2017-10-26: qty 2

## 2017-10-26 MED ORDER — EPHEDRINE SULFATE 50 MG/ML IJ SOLN
INTRAMUSCULAR | Status: AC
  Filled 2017-10-26: qty 1

## 2017-10-26 MED ORDER — FENTANYL CITRATE (PF) 100 MCG/2ML IJ SOLN
INTRAMUSCULAR | Status: DC | PRN
  Administered 2017-10-26 (×2): 50 ug via INTRAVENOUS
  Administered 2017-10-26: 100 ug via INTRAVENOUS
  Administered 2017-10-26: 50 ug via INTRAVENOUS

## 2017-10-26 MED ORDER — FENTANYL CITRATE (PF) 250 MCG/5ML IJ SOLN
INTRAMUSCULAR | Status: AC
  Filled 2017-10-26: qty 5

## 2017-10-26 MED ORDER — ROCURONIUM BROMIDE 50 MG/5ML IV SOLN
INTRAVENOUS | Status: AC
  Filled 2017-10-26: qty 1

## 2017-10-26 MED ORDER — POVIDONE-IODINE 10 % EX OINT
TOPICAL_OINTMENT | CUTANEOUS | Status: AC
  Filled 2017-10-26: qty 1

## 2017-10-26 MED ORDER — MIDAZOLAM HCL 5 MG/5ML IJ SOLN
INTRAMUSCULAR | Status: DC | PRN
  Administered 2017-10-26: 2 mg via INTRAVENOUS

## 2017-10-26 MED ORDER — SCOPOLAMINE 1 MG/3DAYS TD PT72
MEDICATED_PATCH | TRANSDERMAL | Status: AC
  Filled 2017-10-26: qty 1

## 2017-10-26 MED ORDER — LACTATED RINGERS IV SOLN: INTRAVENOUS | Status: DC

## 2017-10-26 SURGICAL SUPPLY — 56 items
APL SRG 38 LTWT LNG FL B (MISCELLANEOUS) ×1
APPLICATOR ARISTA FLEXITIP XL (MISCELLANEOUS) ×3 IMPLANT
APPLIER CLIP ROT 10 11.4 M/L (STAPLE) ×3
APR CLP MED LRG 11.4X10 (STAPLE) ×1
BAG HAMPER (MISCELLANEOUS) ×3 IMPLANT
BAG RETRIEVAL 10 (BASKET) ×1
BAG RETRIEVAL 10MM (BASKET) ×1
CHLORAPREP W/TINT 26ML (MISCELLANEOUS) ×3 IMPLANT
CLIP APPLIE ROT 10 11.4 M/L (STAPLE) ×1 IMPLANT
CLOTH BEACON ORANGE TIMEOUT ST (SAFETY) ×3 IMPLANT
COVER LIGHT HANDLE STERIS (MISCELLANEOUS) ×6 IMPLANT
CUTTER FLEX LINEAR 45M (STAPLE) ×2 IMPLANT
DECANTER SPIKE VIAL GLASS SM (MISCELLANEOUS) ×3 IMPLANT
ELECT REM PT RETURN 9FT ADLT (ELECTROSURGICAL) ×3
ELECTRODE REM PT RTRN 9FT ADLT (ELECTROSURGICAL) ×1 IMPLANT
FILTER SMOKE EVAC LAPAROSHD (FILTER) ×3 IMPLANT
GLOVE BIOGEL PI IND STRL 6.5 (GLOVE) IMPLANT
GLOVE BIOGEL PI IND STRL 7.0 (GLOVE) ×1 IMPLANT
GLOVE BIOGEL PI INDICATOR 6.5 (GLOVE) ×6
GLOVE BIOGEL PI INDICATOR 7.0 (GLOVE) ×2
GLOVE ECLIPSE 7.0 STRL STRAW (GLOVE) ×3 IMPLANT
GLOVE SURG SS PI 6.5 STRL IVOR (GLOVE) ×2 IMPLANT
GLOVE SURG SS PI 7.5 STRL IVOR (GLOVE) ×3 IMPLANT
GOWN STRL REUS W/ TWL XL LVL3 (GOWN DISPOSABLE) ×1 IMPLANT
GOWN STRL REUS W/TWL LRG LVL3 (GOWN DISPOSABLE) ×9 IMPLANT
GOWN STRL REUS W/TWL XL LVL3 (GOWN DISPOSABLE) ×3
HEMOSTAT ARISTA ABSORB 3G PWDR (MISCELLANEOUS) ×2 IMPLANT
HEMOSTAT SNOW SURGICEL 2X4 (HEMOSTASIS) ×3 IMPLANT
INST SET LAPROSCOPIC AP (KITS) ×3 IMPLANT
IV NS IRRIG 3000ML ARTHROMATIC (IV SOLUTION) ×2 IMPLANT
KIT ROOM TURNOVER APOR (KITS) ×3 IMPLANT
MANIFOLD NEPTUNE II (INSTRUMENTS) ×3 IMPLANT
NEEDLE INSUFFLATION 14GA 120MM (NEEDLE) ×3 IMPLANT
NS IRRIG 1000ML POUR BTL (IV SOLUTION) ×3 IMPLANT
PACK LAP CHOLE LZT030E (CUSTOM PROCEDURE TRAY) ×3 IMPLANT
PAD ARMBOARD 7.5X6 YLW CONV (MISCELLANEOUS) ×3 IMPLANT
PENCIL HANDSWITCHING (ELECTRODE) ×3 IMPLANT
RELOAD STAPLE 45 3.5 BLU ETS (ENDOMECHANICALS) IMPLANT
RELOAD STAPLE TA45 3.5 REG BLU (ENDOMECHANICALS) ×3 IMPLANT
SET BASIN LINEN APH (SET/KITS/TRAYS/PACK) ×3 IMPLANT
SET TUBE IRRIG SUCTION NO TIP (IRRIGATION / IRRIGATOR) ×2 IMPLANT
SLEEVE ENDOPATH XCEL 5M (ENDOMECHANICALS) ×3 IMPLANT
SPONGE GAUZE 2X2 8PLY STER LF (GAUZE/BANDAGES/DRESSINGS) ×4
SPONGE GAUZE 2X2 8PLY STRL LF (GAUZE/BANDAGES/DRESSINGS) ×8 IMPLANT
STAPLER VISISTAT (STAPLE) ×3 IMPLANT
SUT VICRYL 0 UR6 27IN ABS (SUTURE) ×3 IMPLANT
SYS BAG RETRIEVAL 10MM (BASKET) ×1
SYSTEM BAG RETRIEVAL 10MM (BASKET) ×1 IMPLANT
TAPE CLOTH SURG 4X10 WHT LF (GAUZE/BANDAGES/DRESSINGS) ×2 IMPLANT
TROCAR ENDO BLADELESS 11MM (ENDOMECHANICALS) ×3 IMPLANT
TROCAR XCEL NON-BLD 5MMX100MML (ENDOMECHANICALS) ×3 IMPLANT
TROCAR XCEL UNIV SLVE 11M 100M (ENDOMECHANICALS) ×3 IMPLANT
TUBE CONNECTING 12'X1/4 (SUCTIONS) ×1
TUBE CONNECTING 12X1/4 (SUCTIONS) ×2 IMPLANT
TUBING INSUFFLATION (TUBING) ×3 IMPLANT
WARMER LAPAROSCOPE (MISCELLANEOUS) ×3 IMPLANT

## 2017-10-26 NOTE — Discharge Summary (Addendum)
Physician Discharge Summary  Samantha Eaton ZCH:[id number] DOB: [birth year] DOA: 10/23/2017  PCP: Asencion Noble, MD  Admit date: 10/23/2017 Discharge date: 10/26/2017  Admitted From:  HOME Disposition:  Home  Recommendations for Outpatient Follow-up:  1. Follow up with PCP in 1-2 weeks 2. Please obtain BMP/CBC in one week  Discharge Condition: Stable CODE STATUS: FULL Diet recommendation: Heart Healthy    Brief/Interim Summary: 56 year old female with a history of hypertension, sarcoidosis, GERD, hypothyroidism, migraine headaches, restless leg syndrome, hyperlipidemia presenting with 3-day history of epigastric and right upper quadrant abdominal pain. The patient started feeling bad approximately 1 week prior to this admission when she felt like she had URI type symptoms. Subsequently, she developed fevers up to 102.0 F with nausea. Her abdominal pain was intermittent initially, but became more constant as the days went on. She denied any chest pain, shortness breath,hemoptysis, diarrhea, hematochezia, melena dysuria, hematuria. Initial evaluation revealed WBC 17.7. RUQ ultrasoundrevealed cholelithiasis with gallbladder wall thickening and mild pericholecystic fluid. The patient was started on Zosyn, and general surgery was consulted to assist with management.    Discharge Diagnoses:  Acute cholecystitis -appreciate general surgery consult -continue IVF--add KCl to fluids -case discussed with Dr. Constance Haw -12/10--lap cholecystectomy  -continued zosyn-->per surgery instructions, pt will be sent home with 5 more days cipro after cholecystectomy -personally reviewed EKG--NSR -personally reviewed CXR--no edema, no infiltrates, bibasilar atelectasis  Intractable headache -likely toxic headache due to acute medical condition -CT brain--neg for acute findings -IV benadryl and compazine--repeated 12/9 -continue IVF -pt also has component of analgesic withdraw--on chronic  meloxicam -outpatient referral to neurology from chronic daily headache, migraine  Essential Hypertension -continue amlodipine  Hyperlipidemia -continue zocor  Hypothyroidism -continue synthroid  Hypokalemia -repleted -all KCl to IVF -check mag--2.2      Discharge Instructions  Discharge Instructions    Ambulatory referral to Neurology   Complete by:  As directed    Refer to [facility] Neurology -chronic daily headache, migraines   Diet - low sodium heart healthy   Complete by:  As directed    Increase activity slowly   Complete by:  As directed      Allergies as of 10/26/2017   No Known Allergies     Medication List    TAKE these medications   acetaminophen 500 MG tablet Commonly known as:  TYLENOL Take 1,000 mg by mouth every 8 (eight) hours as needed.   amLODipine 5 MG tablet Commonly known as:  NORVASC TAKE ONE (1) TABLET BY MOUTH EVERY DAY   cetirizine 10 MG tablet Commonly known as:  ZYRTEC Take 10 mg by mouth daily.   ciprofloxacin 500 MG tablet Commonly known as:  CIPRO Take 1 tablet (500 mg total) by mouth 2 (two) times daily.   escitalopram 10 MG tablet Commonly known as:  LEXAPRO Take 1 tablet (10 mg total) by mouth daily.   HAIR SKIN AND NAILS FORMULA Tabs Take 1 tablet by mouth daily.   hyoscyamine 0.125 MG SL tablet Commonly known as:  LEVSIN SL Take 1 tablet by mouth every 6 (six) hours.   levothyroxine 50 MCG tablet Commonly known as:  SYNTHROID, LEVOTHROID TAKE ONE (1) TABLET EACH DAY   meloxicam 7.5 MG tablet Commonly known as:  MOBIC TAKE ONE (1) TABLET BY MOUTH EVERY DAY   ondansetron 8 MG disintegrating tablet Commonly known as:  ZOFRAN-ODT Take 1 tablet by mouth every 8 (eight) hours as needed for nausea.   oxyCODONE-acetaminophen 5-325 MG tablet Commonly known as:  ROXICET Take 1 tablet by mouth every 4 (four) hours as needed for severe pain.   pramipexole 0.5 MG tablet Commonly known as:  MIRAPEX Take 1  tablet (0.5 mg total) by mouth 2 (two) times daily.   simvastatin 20 MG tablet Commonly known as:  ZOCOR TAKE ONE (1) TABLET BY MOUTH EVERY DAY   valACYclovir 1000 MG tablet Commonly known as:  VALTREX Take 2 then 2 next day for fever blisters prn What changed:    how much to take  how to take this  when to take this  reasons to take this  additional instructions      Follow-up Information    Aviva Signs, MD. Schedule an appointment as soon as possible for a visit on 11/05/2017.   Specialty:  General Surgery Contact information: [address] [place] [address] [phone]          No Known Allergies  Consultations:  General surgery   Procedures/Studies: Ct Head Wo Contrast  Result Date: 10/24/2017 CLINICAL DATA:  Intractable headache EXAM: CT HEAD WITHOUT CONTRAST TECHNIQUE: Contiguous axial images were obtained from the base of the skull through the vertex without intravenous contrast. COMPARISON:  None. FINDINGS: Brain: The ventricles are normal in size and configuration. There is slight invagination of CSF into the sella. There is no intracranial mass, hemorrhage, extra-axial fluid collection, or midline shift. There is minimal small vessel disease in the centra semiovale bilaterally. Elsewhere gray-white compartments appear normal. No evident acute infarct. Vascular: No hyperdense vessel. There is calcification in each carotid siphon and distal vertebral artery. Skull: The bony calvarium appears intact. Sinuses/Orbits: There is mucosal thickening in several ethmoid air cells bilaterally. Other visualized paranasal sinuses are clear. Orbits appear symmetric bilaterally. Other: Mastoid air cells are clear. IMPRESSION: Minimal periventricular small vessel disease. No acute infarct. No hemorrhage or mass. Foci of arterial vascular calcification noted. There is mucosal thickening in several ethmoid air cells. Electronically Signed   By: Lowella Grip  III M.D.   On: 10/24/2017 16:18   US Abdomen Limited  Result Date: 10/23/2017 CLINICAL DATA:  Right upper quadrant pain and fever EXAM: ULTRASOUND ABDOMEN LIMITED RIGHT UPPER QUADRANT COMPARISON:  None. FINDINGS: Gallbladder: Within the gallbladder, there is an echogenic focus which moves and shadows measuring 2.6 cm consistent with cholelithiasis. Gallbladder wall is mildly thickened and mild pericholecystic fluid. Patient is focally tender over the gallbladder. Common bile duct: Diameter: 8 mm, mildly prominent. No biliary duct mass or calculus appreciable. Liver: No focal lesion identified. Liver echogenicity overall is increased. Portal vein is patent on color Doppler imaging with normal direction of blood flow towards the liver. IMPRESSION: 1.  Findings indicative of acute cholecystitis. 2. Mild prominence of the common bile duct without mass or calculus evident. 3. Increased liver echogenicity, a finding most likely indicative of hepatic steatosis. While no focal liver lesions are evident on this study, it must be cautioned that the sensitivity of ultrasound for detection of focal liver lesions is diminished in this circumstance. Electronically Signed   By: Lowella Grip III M.D.   On: 10/23/2017 16:44   Dg Chest Port 1 View  Result Date: 10/25/2017 CLINICAL DATA:  Preoperative exam for gallbladder surgery. EXAM: PORTABLE CHEST 1 VIEW COMPARISON:  None. FINDINGS: Heart size is normal. Mediastinal shadows are normal. Left lung is clear. There is mild atelectasis or scarring in the right perihilar region. No visible effusion. Bony structures unremarkable. IMPRESSION: Mild scarring or atelectasis in the right perihilar region. Electronically  Signed   By: Nelson Chimes M.D.   On: 10/25/2017 07:30   Mm Screening Breast Tomo Bilateral  Result Date: 09/28/2017 CLINICAL DATA:  Screening. EXAM: 2D DIGITAL SCREENING BILATERAL MAMMOGRAM WITH CAD AND ADJUNCT TOMO COMPARISON:  Previous exam(s). ACR Breast  Density Category a: The breast tissue is almost entirely fatty. FINDINGS: There are no findings suspicious for malignancy. Images were processed with CAD. IMPRESSION: No mammographic evidence of malignancy. A result letter of this screening mammogram will be mailed directly to the patient. RECOMMENDATION: Screening mammogram in one year. (Code:SM-B-01Y) BI-RADS CATEGORY  1: Negative. Electronically Signed   By: Ammie Ferrier M.D.   On: 09/28/2017 16:36        Discharge Exam: Vitals:   10/26/17 1300 10/26/17 1315  BP: 117/81 105/83  Pulse: 74 73  Resp: 15 18  Temp:    SpO2: 92% 90%   Vitals:   10/26/17 1245 10/26/17 1259 10/26/17 1300 10/26/17 1315  BP: 107/66 117/81 117/81 105/83  Pulse:  77 74 73  Resp: 16 20 15 18   Temp:      TempSrc:      SpO2: 100% (!) 88% 92% 90%  Weight:      Height:        General: Pt is alert, awake, not in acute distress Cardiovascular: RRR, S1/S2 +, no rubs, no gallops Respiratory: CTA bilaterally, no wheezing, no rhonchi Abdominal: Soft, NT, ND, bowel sounds + Extremities: no edema, no cyanosis   The results of significant diagnostics from this hospitalization (including imaging, microbiology, ancillary and laboratory) are listed below for reference.    Significant Diagnostic Studies: Ct Head Wo Contrast  Result Date: 10/24/2017 CLINICAL DATA:  Intractable headache EXAM: CT HEAD WITHOUT CONTRAST TECHNIQUE: Contiguous axial images were obtained from the base of the skull through the vertex without intravenous contrast. COMPARISON:  None. FINDINGS: Brain: The ventricles are normal in size and configuration. There is slight invagination of CSF into the sella. There is no intracranial mass, hemorrhage, extra-axial fluid collection, or midline shift. There is minimal small vessel disease in the centra semiovale bilaterally. Elsewhere gray-white compartments appear normal. No evident acute infarct. Vascular: No hyperdense vessel. There is  calcification in each carotid siphon and distal vertebral artery. Skull: The bony calvarium appears intact. Sinuses/Orbits: There is mucosal thickening in several ethmoid air cells bilaterally. Other visualized paranasal sinuses are clear. Orbits appear symmetric bilaterally. Other: Mastoid air cells are clear. IMPRESSION: Minimal periventricular small vessel disease. No acute infarct. No hemorrhage or mass. Foci of arterial vascular calcification noted. There is mucosal thickening in several ethmoid air cells. Electronically Signed   By: Lowella Grip III M.D.   On: 10/24/2017 16:18   US Abdomen Limited  Result Date: 10/23/2017 CLINICAL DATA:  Right upper quadrant pain and fever EXAM: ULTRASOUND ABDOMEN LIMITED RIGHT UPPER QUADRANT COMPARISON:  None. FINDINGS: Gallbladder: Within the gallbladder, there is an echogenic focus which moves and shadows measuring 2.6 cm consistent with cholelithiasis. Gallbladder wall is mildly thickened and mild pericholecystic fluid. Patient is focally tender over the gallbladder. Common bile duct: Diameter: 8 mm, mildly prominent. No biliary duct mass or calculus appreciable. Liver: No focal lesion identified. Liver echogenicity overall is increased. Portal vein is patent on color Doppler imaging with normal direction of blood flow towards the liver. IMPRESSION: 1.  Findings indicative of acute cholecystitis. 2. Mild prominence of the common bile duct without mass or calculus evident. 3. Increased liver echogenicity, a finding most likely indicative of hepatic steatosis.  While no focal liver lesions are evident on this study, it must be cautioned that the sensitivity of ultrasound for detection of focal liver lesions is diminished in this circumstance. Electronically Signed   By: Lowella Grip III M.D.   On: 10/23/2017 16:44   Dg Chest Port 1 View  Result Date: 10/25/2017 CLINICAL DATA:  Preoperative exam for gallbladder surgery. EXAM: PORTABLE CHEST 1 VIEW COMPARISON:   None. FINDINGS: Heart size is normal. Mediastinal shadows are normal. Left lung is clear. There is mild atelectasis or scarring in the right perihilar region. No visible effusion. Bony structures unremarkable. IMPRESSION: Mild scarring or atelectasis in the right perihilar region. Electronically Signed   By: Nelson Chimes M.D.   On: 10/25/2017 07:30   Mm Screening Breast Tomo Bilateral  Result Date: 09/28/2017 CLINICAL DATA:  Screening. EXAM: 2D DIGITAL SCREENING BILATERAL MAMMOGRAM WITH CAD AND ADJUNCT TOMO COMPARISON:  Previous exam(s). ACR Breast Density Category a: The breast tissue is almost entirely fatty. FINDINGS: There are no findings suspicious for malignancy. Images were processed with CAD. IMPRESSION: No mammographic evidence of malignancy. A result letter of this screening mammogram will be mailed directly to the patient. RECOMMENDATION: Screening mammogram in one year. (Code:SM-B-01Y) BI-RADS CATEGORY  1: Negative. Electronically Signed   By: Ammie Ferrier M.D.   On: 09/28/2017 16:36     Microbiology: Recent Results (from the past 240 hour(s))  Surgical pcr screen     Status: None   Collection Time: 10/25/17  6:27 PM  Result Value Ref Range Status   MRSA, PCR NEGATIVE NEGATIVE Final   Staphylococcus aureus NEGATIVE NEGATIVE Final    Comment: (NOTE) The Xpert SA Assay (FDA approved for NASAL specimens in patients 91 years of age and older), is one component of a comprehensive surveillance program. It is not intended to diagnose infection nor to guide or monitor treatment.      Labs: Basic Metabolic Panel: Recent Labs  Lab 10/23/17 1351 10/24/17 0540 10/25/17 0545  NA 139 137 137  K 3.3* 3.1* 4.1  CL 103 102 104  CO2 26 25 24   GLUCOSE 93 112* 103*  BUN 21* 16 9  CREATININE 0.80 0.85 0.71  CALCIUM 9.7 8.7* 9.2  MG  --   --  2.2   Liver Function Tests: Recent Labs  Lab 10/23/17 1351 10/24/17 0540 10/25/17 0545  AST 27 50* 29  ALT 37 49 40  ALKPHOS 129*  177* 151*  BILITOT 0.7 0.7 0.4  PROT 8.8* 7.5 7.4  ALBUMIN 3.5 3.0* 3.0*   Recent Labs  Lab 10/23/17 1351  LIPASE 23   No results for input(s): AMMONIA in the last 168 hours. CBC: Recent Labs  Lab 10/23/17 1351 10/24/17 0540 10/25/17 0545 10/26/17 0624  WBC 17.7* 14.4* 12.5* 13.5*  HGB 11.1* 9.6* 9.9* 9.9*  HCT 34.6* 30.1* 31.4* 31.3*  MCV 93.0 93.8 94.3 94.0  PLT 591* 601* 697* 796*   Cardiac Enzymes: No results for input(s): CKTOTAL, CKMB, CKMBINDEX, TROPONINI in the last 168 hours. BNP: Invalid input(s): POCBNP CBG: No results for input(s): GLUCAP in the last 168 hours.  Time coordinating discharge:  Less than 30 minutes  Signed:  Orson Eva, DO [facility] Pager: [phone] 10/26/2017, 1:33 PM

## 2017-10-26 NOTE — Transfer of Care (Signed)
Immediate Anesthesia Transfer of Care Note  Patient: Samantha Eaton  Procedure(s) Performed: LAPAROSCOPIC CHOLECYSTECTOMY (N/A )  Patient Location: PACU  Anesthesia Type:General  Level of Consciousness: drowsy  Airway & Oxygen Therapy: Patient Spontanous Breathing and Patient connected to face mask oxygen  Post-op Assessment: Report given to RN and Post -op Vital signs reviewed and stable  Post vital signs: Reviewed and stable  Last Vitals:  Vitals:   10/26/17 1100 10/26/17 1105  BP:    Pulse:    Resp: (!) 26 (!) 31  Temp:    SpO2: 95% 97%    Last Pain:  Vitals:   10/26/17 0600  TempSrc: Oral  PainSc:       Patients Stated Pain Goal: 0 (18/29/93 7169)  Complications: No apparent anesthesia complications

## 2017-10-26 NOTE — Anesthesia Postprocedure Evaluation (Signed)
Anesthesia Post Note  Patient: Samantha Eaton  Procedure(s) Performed: LAPAROSCOPIC CHOLECYSTECTOMY (N/A )  Patient location during evaluation: PACU Anesthesia Type: General Level of consciousness: awake and alert Pain management: satisfactory to patient Vital Signs Assessment: post-procedure vital signs reviewed and stable Respiratory status: spontaneous breathing and patient connected to nasal cannula oxygen Cardiovascular status: stable Postop Assessment: no apparent nausea or vomiting Anesthetic complications: no     Last Vitals:  Vitals:   10/26/17 1330 10/26/17 1339  BP: 122/66   Pulse: 70 71  Resp: (!) 25 16  Temp:    SpO2: 95% (!) 88%    Last Pain:  Vitals:   10/26/17 1339  TempSrc:   PainSc: 8                  Anthonyjames Bargar

## 2017-10-26 NOTE — Progress Notes (Signed)
Patient states understanding of discharge instructions, prescriptions given 

## 2017-10-26 NOTE — Discharge Instructions (Signed)
Laparoscopic Cholecystectomy, Care After °This sheet gives you information about how to care for yourself after your procedure. Your doctor may also give you more specific instructions. If you have problems or questions, contact your doctor. °Follow these instructions at home: °Care for cuts from surgery (incisions) ° °· Follow instructions from your doctor about how to take care of your cuts from surgery. Make sure you: °? Wash your hands with soap and water before you change your bandage (dressing). If you cannot use soap and water, use hand sanitizer. °? Change your bandage as told by your doctor. °? Leave stitches (sutures), skin glue, or skin tape (adhesive) strips in place. They may need to stay in place for 2 weeks or longer. If tape strips get loose and curl up, you may trim the loose edges. Do not remove tape strips completely unless your doctor says it is okay. °· Do not take baths, swim, or use a hot tub until your doctor says it is okay. Ask your doctor if you can take showers. You may only be allowed to take sponge baths for bathing. °· Check your surgical cut area every day for signs of infection. Check for: °? More redness, swelling, or pain. °? More fluid or blood. °? Warmth. °? Pus or a bad smell. °Activity °· Do not drive or use heavy machinery while taking prescription pain medicine. °· Do not lift anything that is heavier than 10 lb (4.5 kg) until your doctor says it is okay. °· Do not play contact sports until your doctor says it is okay. °· Do not drive for 24 hours if you were given a medicine to help you relax (sedative). °· Rest as needed. Do not return to work or school until your doctor says it is okay. °General instructions °· Take over-the-counter and prescription medicines only as told by your doctor. °· To prevent or treat constipation while you are taking prescription pain medicine, your doctor may recommend that you: °? Drink enough fluid to keep your pee (urine) clear or pale  yellow. °? Take over-the-counter or prescription medicines. °? Eat foods that are high in fiber, such as fresh fruits and vegetables, whole grains, and beans. °? Limit foods that are high in fat and processed sugars, such as fried and sweet foods. °Contact a doctor if: °· You develop a rash. °· You have more redness, swelling, or pain around your surgical cuts. °· You have more fluid or blood coming from your surgical cuts. °· Your surgical cuts feel warm to the touch. °· You have pus or a bad smell coming from your surgical cuts. °· You have a fever. °· One or more of your surgical cuts breaks open. °Get help right away if: °· You have trouble breathing. °· You have chest pain. °· You have pain that is getting worse in your shoulders. °· You faint or feel dizzy when you stand. °· You have very bad pain in your belly (abdomen). °· You are sick to your stomach (nauseous) for more than one day. °· You have throwing up (vomiting) that lasts for more than one day. °· You have leg pain. °This information is not intended to replace advice given to you by your health care provider. Make sure you discuss any questions you have with your health care provider. °Document Released: 08/12/2008 Document Revised: 05/24/2016 Document Reviewed: 04/21/2016 °Elsevier Interactive Patient Education © 2017 [employer] ° °

## 2017-10-26 NOTE — Progress Notes (Addendum)
PT negative for MRSA and Staph. No pain reported at this time. IV patent and infusing. CHG wipes used 10/25/17 2200 and 10/26/17 0545. NPO since MN. Will remind day nurse to have PT empty bladder prior to surgery. SCDs on. Informed consent signed

## 2017-10-26 NOTE — Interval H&P Note (Signed)
History and Physical Interval Note:  10/26/2017 9:16 AM  Samantha Eaton  has presented today for surgery, with the diagnosis of acute cholecystitis  The various methods of treatment have been discussed with the patient and family. After consideration of risks, benefits and other options for treatment, the patient has consented to  Procedure(s): LAPAROSCOPIC CHOLECYSTECTOMY (N/A) as a surgical intervention .  The patient's history has been reviewed, patient examined, no change in status, stable for surgery.  I have reviewed the patient's chart and labs.  Questions were answered to the patient's satisfaction.     Aviva Signs

## 2017-10-26 NOTE — Op Note (Signed)
Patient:  Samantha Eaton  DOB:  [birth year]  MRN:  [id number]   Preop Diagnosis: Acute cholecystitis, cholelithiasis  Postop Diagnosis: Same, empyema  Procedure: Laparoscopic cholecystectomy  Surgeon: Aviva Signs, MD  Anes: General endotracheal  Indications: Patient is a 56 year old white female who was admitted to the hospital for right upper quadrant abdominal pain.  She was diagnosed with acute cholecystitis secondary to cholelithiasis.  She now presents for laparoscopic cholecystectomy.  The risks and benefits of the procedure including bleeding, infection, hepatobiliary injury, and the possibility of an open procedure were fully explained to the patient, who gave informed consent.  Procedure note: The patient was placed in supine position.  After induction of general endotracheal anesthesia, the abdomen was prepped and draped using the usual sterile technique with DuraPrep.  Surgical site confirmation was performed.  An infraumbilical incision was made down to the fascia.  A Veress needle was introduced into the abdominal cavity and confirmation of placement was done using saline drop test.  The abdomen was then insufflated to 16 mmHg pressure.  An 11 mm trocar was introduced into the abdominal cavity under direct visualization without difficulty.  The patient was placed in reverse Trendelenburg position and an additional 11 mm trocar was placed in the epigastric region and 5 mm trochars were placed in the right upper quadrant and right flank regions.  The liver was inspected and noted to be within normal limits.  The gallbladder was noted to be distended with a stone impacted in the infundibulum of the gallbladder.  In order to facilitate exposure, the gallbladder was decompressed at its fundus.  Purulent fluid was aspirated.  The gallbladder was then retracted in a dynamic fashion in order to provide a critical view of the triangle of Calot.  The cystic duct was first identified.  Its  juncture to the infundibulum was fully identified.  The common duct was identified.  The cystic artery was identified.  A standard Endo GIA was placed across the cystic duct and fired.  The cystic duct was ligated and divided using clips.  The gallbladder was then freed away from the gallbladder fossa using Bovie electrocautery.  The gallbladder was delivered through the epigastric trocar site using an Endo Catch bag.  The gallbladder fossa was copiously irrigated with normal saline.  Any bleeding was controlled using Bovie electrocautery.  Arista and Surgicel were placed in the gallbladder bed.  All fluid and air were then evacuated from the abdominal cavity prior to the removal of the trochars.  All wounds were irrigated with normal saline.  All wounds were injected with 0.5% Sensorcaine.  The epigastric fascia was reapproximated using 0 Vicryl interrupted sutures.  All skin incisions were closed using staples.  Betadine ointment and dry sterile dressings were applied.  All tape needle counts were correct at the end of the procedure.  Patient was extubated in the operating room and transferred to PACU in stable condition.  Complications: None  EBL: Minimal  Specimen: Gallbladder

## 2017-10-26 NOTE — Anesthesia Preprocedure Evaluation (Signed)
Anesthesia Evaluation  Patient identified by MRN, date of birth, ID band Patient awake    Reviewed: Allergy & Precautions, H&P , NPO status , Patient's Chart, lab work & pertinent test results  History of Anesthesia Complications (+) PONV and history of anesthetic complications  Airway Mallampati: I  TM Distance: >3 FB Neck ROM: Full    Dental  (+) Edentulous Upper, Dental Advisory Given   Pulmonary sleep apnea and Continuous Positive Airway Pressure Ventilation ,  Sarcoid hx    breath sounds clear to auscultation       Cardiovascular hypertension, Pt. on medications (-) angina+ DVT  Normal cardiovascular exam Rhythm:Regular Rate:Normal     Neuro/Psych  Headaches, PSYCHIATRIC DISORDERS Anxiety negative neurological ROS     GI/Hepatic Neg liver ROS, GERD  Medicated and Controlled,  Endo/Other  Hypothyroidism Morbid obesity  Renal/GU negative Renal ROS     Musculoskeletal  (+) Arthritis ,   Abdominal (+) + obese,   Peds  Hematology negative hematology ROS (+)   Anesthesia Other Findings   Reproductive/Obstetrics                             Anesthesia Physical Anesthesia Plan  ASA: III  Anesthesia Plan: General   Post-op Pain Management:    Induction: Intravenous, Rapid sequence and Cricoid pressure planned  PONV Risk Score and Plan:   Airway Management Planned: Oral ETT  Additional Equipment:   Intra-op Plan:   Post-operative Plan: Extubation in OR  Informed Consent: I have reviewed the patients History and Physical, chart, labs and discussed the procedure including the risks, benefits and alternatives for the proposed anesthesia with the patient or authorized representative who has indicated his/her understanding and acceptance.     Plan Discussed with:   Anesthesia Plan Comments:         Anesthesia Quick Evaluation

## 2017-10-26 NOTE — Anesthesia Procedure Notes (Signed)
Procedure Name: Intubation Date/Time: 10/26/2017 11:19 AM Performed by: Charmaine Downs, CRNA Pre-anesthesia Checklist: Patient identified, Patient being monitored, Timeout performed, Emergency Drugs available and Suction available Patient Re-evaluated:Patient Re-evaluated prior to induction Oxygen Delivery Method: Circle System Utilized Preoxygenation: Pre-oxygenation with 100% oxygen Induction Type: IV induction Ventilation: Mask ventilation without difficulty Laryngoscope Size: Mac and 3 Grade View: Grade II Tube type: Oral Tube size: 7.0 mm Number of attempts: 1 Airway Equipment and Method: stylet Placement Confirmation: ETT inserted through vocal cords under direct vision,  positive ETCO2 and breath sounds checked- equal and bilateral Secured at: 21 cm Tube secured with: Tape Dental Injury: Teeth and Oropharynx as per pre-operative assessment

## 2017-10-27 ENCOUNTER — Encounter (HOSPITAL_COMMUNITY): Payer: Self-pay | Admitting: General Surgery

## 2017-10-28 ENCOUNTER — Other Ambulatory Visit: Payer: Self-pay | Admitting: Obstetrics & Gynecology

## 2017-11-03 ENCOUNTER — Other Ambulatory Visit: Payer: Self-pay | Admitting: *Deleted

## 2017-11-03 ENCOUNTER — Encounter: Payer: Self-pay | Admitting: *Deleted

## 2017-11-03 NOTE — Patient Outreach (Signed)
[facility]) Care Management  11/03/2017  BURNA ATLAS 1961/05/10 [id number]   Subjective: Telephone call to patient's home number, spoke with patient, and HIPAA verified.  Discussed [facility] [employer] Transition of care follow up, patient voiced understanding, and is in agreement to follow up.   Patient states she is doing well, avoiding foods that she thinks are not good after surgery, such as greasy / fatty foods, and has a follow up appointment with surgeon on 11/05/17.   Patient states she is able to manage self care and has assistance as needed. Patient voices understanding of medical diagnosis, surgery, and treatment plan. States she is accessing her Christella Scheuermann benefits as needed via member services number on back of card or through [email or web address]  Patient states she does not have any education material, transition of care, care coordination, disease management, disease monitoring, transportation, community resource, or pharmacy needs at this time.  States she is very appreciative of the follow up and is in agreement to receive [place] [facility] information.    Objective:  Per KPN (Knowledge Performance Now, point of care tool), [employer] iCollaborate, and chart review, patient hospitalized 10/23/17 - 10/26/17 for Acute cholecystitis.   Status post Laparoscopic cholecystectomy on 10/26/17.   Patient also has a history of hypertension, sarcoidosis, GERD, hypothyroidism, migraine headaches, restless leg syndrome, and hyperlipidemia.        Assessment: Received [employer] Transition of care referral on 11/03/17. Transition of care follow up completed, no care management needs, and will proceed with case closure.      Plan: RNCM will send patient successful outreach letter, [facility] pamphlet, and magnet. RNCM will send case closure due to follow up completed / no care management needs request to [facility].      Alaric Gladwin H. Annia Friendly, BSN, [place]  Management [facility] Telephonic CM Phone: [phone] Fax: [phone]

## 2017-11-05 ENCOUNTER — Ambulatory Visit (INDEPENDENT_AMBULATORY_CARE_PROVIDER_SITE_OTHER): Payer: Self-pay | Admitting: General Surgery

## 2017-11-05 ENCOUNTER — Encounter: Payer: Self-pay | Admitting: General Surgery

## 2017-11-05 VITALS — BP 115/74 | HR 90 | Temp 98.6°F | Ht 65.0 in | Wt 214.0 lb

## 2017-11-05 DIAGNOSIS — Z09 Encounter for follow-up examination after completed treatment for conditions other than malignant neoplasm: Secondary | ICD-10-CM

## 2017-11-05 NOTE — Progress Notes (Signed)
Subjective:     Samantha Eaton  Status post laparoscopic cholecystectomy for acute cholecystitis secondary to cholelithiasis.  Has been doing well.  She has minimal pain.  No fever or chills have been noted. Objective:    BP 115/74   Pulse 90   Temp 98.6 F (37 C)   Ht 5\' 5"  (1.651 m)   Wt 214 lb (97.1 kg)   BMI 35.61 kg/m   General:  alert, cooperative and no distress  Abdomen soft, incisions healing well.  Staples removed, Steri-Strips applied. Final pathology consistent with diagnosis.     Assessment:    Doing well postoperatively.    Plan:   Increase activity as able.  May return to work without restrictions on 11/18/2017.  Follow-up here as needed.

## 2017-11-18 ENCOUNTER — Other Ambulatory Visit: Payer: Self-pay | Admitting: Adult Health

## 2017-11-26 ENCOUNTER — Ambulatory Visit (INDEPENDENT_AMBULATORY_CARE_PROVIDER_SITE_OTHER): Payer: Managed Care, Other (non HMO) | Admitting: Adult Health

## 2017-11-26 ENCOUNTER — Encounter: Payer: Self-pay | Admitting: Adult Health

## 2017-11-26 ENCOUNTER — Other Ambulatory Visit: Payer: Managed Care, Other (non HMO) | Admitting: Adult Health

## 2017-11-26 VITALS — BP 130/80 | HR 72 | Ht 64.0 in | Wt 215.5 lb

## 2017-11-26 DIAGNOSIS — Z1212 Encounter for screening for malignant neoplasm of rectum: Secondary | ICD-10-CM

## 2017-11-26 DIAGNOSIS — E7800 Pure hypercholesterolemia, unspecified: Secondary | ICD-10-CM | POA: Diagnosis not present

## 2017-11-26 DIAGNOSIS — E038 Other specified hypothyroidism: Secondary | ICD-10-CM

## 2017-11-26 DIAGNOSIS — F419 Anxiety disorder, unspecified: Secondary | ICD-10-CM | POA: Diagnosis not present

## 2017-11-26 DIAGNOSIS — Z01411 Encounter for gynecological examination (general) (routine) with abnormal findings: Secondary | ICD-10-CM

## 2017-11-26 DIAGNOSIS — I10 Essential (primary) hypertension: Secondary | ICD-10-CM | POA: Diagnosis not present

## 2017-11-26 DIAGNOSIS — Z1211 Encounter for screening for malignant neoplasm of colon: Secondary | ICD-10-CM

## 2017-11-26 DIAGNOSIS — Z01419 Encounter for gynecological examination (general) (routine) without abnormal findings: Secondary | ICD-10-CM

## 2017-11-26 DIAGNOSIS — Z131 Encounter for screening for diabetes mellitus: Secondary | ICD-10-CM

## 2017-11-26 DIAGNOSIS — Z1321 Encounter for screening for nutritional disorder: Secondary | ICD-10-CM | POA: Diagnosis not present

## 2017-11-26 LAB — HEMOCCULT GUIAC POC 1CARD (OFFICE): Fecal Occult Blood, POC: NEGATIVE

## 2017-11-26 MED ORDER — ESCITALOPRAM OXALATE 10 MG PO TABS: 10.0000 mg | ORAL_TABLET | Freq: Every day | ORAL | 4 refills | Status: DC

## 2017-11-26 MED ORDER — PRAMIPEXOLE DIHYDROCHLORIDE 0.5 MG PO TABS: 0.5000 mg | ORAL_TABLET | Freq: Two times a day (BID) | ORAL | 3 refills | Status: DC

## 2017-11-26 MED ORDER — SIMVASTATIN 20 MG PO TABS: 20.0000 mg | ORAL_TABLET | Freq: Every day | ORAL | 4 refills | Status: DC

## 2017-11-26 MED ORDER — VALACYCLOVIR HCL 1 G PO TABS: 1000.0000 mg | ORAL_TABLET | Freq: Every day | ORAL | 1 refills | Status: DC | PRN

## 2017-11-26 MED ORDER — AMLODIPINE BESYLATE 5 MG PO TABS: 5.0000 mg | ORAL_TABLET | Freq: Every day | ORAL | 4 refills | Status: DC

## 2017-11-26 MED ORDER — MELOXICAM 7.5 MG PO TABS: ORAL_TABLET | ORAL | 1 refills | Status: DC

## 2017-11-26 MED ORDER — LEVOTHYROXINE SODIUM 50 MCG PO TABS: ORAL_TABLET | ORAL | 4 refills | Status: DC

## 2017-11-26 NOTE — Progress Notes (Signed)
History of Present Illness:  Samantha Eaton is a 57 year old white female, married, sp hysterectomy in for well woman gyn exam and requests fasting labs.Still working for [employer] [place].  PCP is Dr Willey Blade.   Current Medications, Allergies, Past Medical History, Past Surgical History, Family History and Social History were reviewed in [employer] record.     Review of Systems:  Patient denies any headaches, hearing loss, fatigue, blurred vision, shortness of breath, chest pain, abdominal pain, problems with bowel movements, urination, or intercourse(not currently active). No joint pain or mood swings.+body aches, has had more gas and BMs since GB surgery 10/27/17.   Physical Exam:BP 130/80 (BP Location: Left Arm, Patient Position: Sitting, Cuff Size: Large)   Pulse 72   Ht 5\' 4"  (1.626 m)   Wt 215 lb 8 oz (97.8 kg)   BMI 36.99 kg/m  General:  Well developed, well nourished, no acute distress Skin:  Warm and dry Neck:  Midline trachea, normal thyroid, good ROM, no lymphadenopathy Lungs; Clear to auscultation bilaterally Breast:  No dominant palpable mass, retraction, or nipple discharge Cardiovascular: Regular rate and rhythm Abdomen:  Soft, non tender, no hepatosplenomegaly.well healed scar from recent GB removal Pelvic:  External genitalia is normal in appearance, no lesions.  The vagina is normal in appearance. Urethra has no lesions or masses. The cervix and uterus are absent.  No adnexal masses or tenderness noted.Bladder is non tender, no masses felt. Rectal: Good sphincter tone, no polyps, or hemorrhoids felt.  Hemoccult negative. Extremities/musculoskeletal:  No swelling or varicosities noted, no clubbing or cyanosis Psych:  No mood changes, alert and cooperative,seems happy PHQ 2 score 1.Can try taking Mobic 15 mg and see if helps with body aches more than 7.5 and if does, may increase prescription.   Impression: 1. Well woman exam with  routine gynecological exam   2. Other specified hypothyroidism   3. Essential hypertension   4. Elevated cholesterol   5. Anxiety   6. Screening for colorectal cancer   7. Screening for diabetes mellitus   8. Encounter for vitamin deficiency screening       Plan: Meds ordered this encounter  Medications  . amLODipine (NORVASC) 5 MG tablet    Sig: Take 1 tablet (5 mg total) by mouth daily.    Dispense:  90 tablet    Refill:  4    Order Specific Question:   Supervising Provider    Answer:   Elonda Husky, LUTHER H [[id number]]  . escitalopram (LEXAPRO) 10 MG tablet    Sig: Take 1 tablet (10 mg total) by mouth daily.    Dispense:  90 tablet    Refill:  4    Order Specific Question:   Supervising Provider    Answer:   Elonda Husky, LUTHER H [[id number]]  . levothyroxine (SYNTHROID, LEVOTHROID) 50 MCG tablet    Sig: TAKE ONE (1) TABLET EACH DAY    Dispense:  90 tablet    Refill:  4    Order Specific Question:   Supervising Provider    Answer:   Elonda Husky, LUTHER H [[id number]]  . pramipexole (MIRAPEX) 0.5 MG tablet    Sig: Take 1 tablet (0.5 mg total) by mouth 2 (two) times daily.    Dispense:  180 tablet    Refill:  3    Order Specific Question:   Supervising Provider    Answer:   Elonda Husky, LUTHER H [[id number]]  . simvastatin (ZOCOR) 20 MG tablet    Sig:  Take 1 tablet (20 mg total) by mouth daily.    Dispense:  90 tablet    Refill:  4    Order Specific Question:   Supervising Provider    Answer:   Elonda Husky, LUTHER H [[id number]]  . valACYclovir (VALTREX) 1000 MG tablet    Sig: Take 1 tablet (1,000 mg total) by mouth daily as needed (fever blister). Take 2 then 2 next day for fever blisters prn    Dispense:  30 tablet    Refill:  1    Order Specific Question:   Supervising Provider    Answer:   Elonda Husky, LUTHER H [[id number]]  . meloxicam (MOBIC) 7.5 MG tablet    Sig: TAKE ONE (1) TABLET BY MOUTH EVERY DAY    Dispense:  30 tablet    Refill:  1    Order Specific Question:   Supervising Provider    Answer:   Tania Ade H [[id number]]   Check CBC,CMP,TSH and lipids,A1c and vitamin D Physical in 1 year Mammogram yearly

## 2017-11-27 ENCOUNTER — Telehealth: Payer: Self-pay | Admitting: Adult Health

## 2017-11-27 LAB — LIPID PANEL
CHOL/HDL RATIO: 3.8 ratio (ref 0.0–4.4)
Cholesterol, Total: 181 mg/dL (ref 100–199)
HDL: 48 mg/dL (ref >39)
LDL CALC: 102 mg/dL — AB (ref 0–99)
TRIGLYCERIDES: 157 mg/dL — AB (ref 0–149)
VLDL Cholesterol Cal: 31 mg/dL (ref 5–40)

## 2017-11-27 LAB — COMPREHENSIVE METABOLIC PANEL
A/G RATIO: 1.5 (ref 1.2–2.2)
ALBUMIN: 4.5 g/dL (ref 3.5–5.5)
ALT: 16 IU/L (ref 0–32)
AST: 18 IU/L (ref 0–40)
Alkaline Phosphatase: 77 IU/L (ref 39–117)
BUN/Creatinine Ratio: 25 — ABNORMAL HIGH (ref 9–23)
BUN: 19 mg/dL (ref 6–24)
Bilirubin Total: 0.3 mg/dL (ref 0.0–1.2)
CALCIUM: 10.1 mg/dL (ref 8.7–10.2)
CO2: 25 mmol/L (ref 20–29)
Chloride: 104 mmol/L (ref 96–106)
Creatinine, Ser: 0.76 mg/dL (ref 0.57–1.00)
GFR, EST AFRICAN AMERICAN: 101 mL/min/{1.73_m2} (ref >59)
GFR, EST NON AFRICAN AMERICAN: 88 mL/min/{1.73_m2} (ref >59)
Globulin, Total: 3.1 g/dL (ref 1.5–4.5)
Glucose: 94 mg/dL (ref 65–99)
Potassium: 5.1 mmol/L (ref 3.5–5.2)
Sodium: 141 mmol/L (ref 134–144)
TOTAL PROTEIN: 7.6 g/dL (ref 6.0–8.5)

## 2017-11-27 LAB — CBC
HEMOGLOBIN: 12.2 g/dL (ref 11.1–15.9)
Hematocrit: 37.3 % (ref 34.0–46.6)
MCH: 30 pg (ref 26.6–33.0)
MCHC: 32.7 g/dL (ref 31.5–35.7)
MCV: 92 fL (ref 79–97)
Platelets: 465 10*3/uL — ABNORMAL HIGH (ref 150–379)
RBC: 4.06 x10E6/uL (ref 3.77–5.28)
RDW: 15.2 % (ref 12.3–15.4)
WBC: 11 10*3/uL — AB (ref 3.4–10.8)

## 2017-11-27 LAB — TSH: TSH: 2.73 u[IU]/mL (ref 0.450–4.500)

## 2017-11-27 LAB — HEMOGLOBIN A1C
Est. average glucose Bld gHb Est-mCnc: 123 mg/dL
Hgb A1c MFr Bld: 5.9 % — ABNORMAL HIGH (ref 4.8–5.6)

## 2017-11-27 LAB — VITAMIN D 25 HYDROXY (VIT D DEFICIENCY, FRACTURES): Vit D, 25-Hydroxy: 30.9 ng/mL (ref 30.0–100.0)

## 2017-11-27 NOTE — Telephone Encounter (Signed)
Left message to call on Monday about labs

## 2017-11-30 ENCOUNTER — Telehealth: Payer: Self-pay | Admitting: Adult Health

## 2017-11-30 MED ORDER — MELOXICAM 15 MG PO TABS: 15.0000 mg | ORAL_TABLET | Freq: Every day | ORAL | 3 refills | Status: DC

## 2017-11-30 NOTE — Telephone Encounter (Signed)
Pt aware labs look good, she says she feels better on mobic 15, will rx

## 2017-12-10 ENCOUNTER — Other Ambulatory Visit: Payer: Managed Care, Other (non HMO) | Admitting: Adult Health

## 2017-12-23 ENCOUNTER — Encounter: Payer: Self-pay | Admitting: Family Medicine

## 2017-12-23 ENCOUNTER — Ambulatory Visit: Payer: Self-pay | Admitting: Family Medicine

## 2017-12-23 VITALS — BP 130/86 | HR 76 | Temp 97.8°F

## 2017-12-23 DIAGNOSIS — J069 Acute upper respiratory infection, unspecified: Secondary | ICD-10-CM

## 2017-12-23 MED ORDER — IPRATROPIUM BROMIDE 0.03 % NA SOLN: 2.0000 | Freq: Three times a day (TID) | NASAL | 0 refills | Status: DC

## 2017-12-23 MED ORDER — BENZONATATE 100 MG PO CAPS: 100.0000 mg | ORAL_CAPSULE | Freq: Three times a day (TID) | ORAL | 0 refills | Status: DC | PRN

## 2017-12-23 MED ORDER — HYDROCODONE-HOMATROPINE 5-1.5 MG/5ML PO SYRP: ORAL_SOLUTION | ORAL | 0 refills | Status: DC

## 2017-12-23 NOTE — Patient Instructions (Signed)
Drink plenty of fluids and try to get enough rest  Use the Atrovent (ipratropium) nasal spray 2 sprays each nostril 3 times daily as needed for runny nose.  Take the benzonatate cough pills 1 or 2 pills 3 times daily for daytime cough (Tessalon).  This is nonsedating.  Hycodan cough syrup 1 teaspoon every 4-6 hours as needed for nighttime cough.  This is sedating.  Take over-the-counter DM-containing cough syrup such as Delsym, Robitussin-DM, or Mucinex DM if needed for bad cough.   You can take plain Mucinex if needed to thin secretions.  If head is too congested take Claritin-D or Allegra-D over-the-counter  If you develop worsening fevers, shortness of breath, worse cough, or generally are getting worse get rechecked.   Upper Respiratory Infection, Adult Most upper respiratory infections (URIs) are caused by a virus. A URI affects the nose, throat, and upper air passages. The most common type of URI is often called "the common cold." Follow these instructions at home:  Take medicines only as told by your doctor.  Gargle warm saltwater or take cough drops to comfort your throat as told by your doctor.  Use a warm mist humidifier or inhale steam from a shower to increase air moisture. This may make it easier to breathe.  Drink enough fluid to keep your pee (urine) clear or pale yellow.  Eat soups and other clear broths.  Have a healthy diet.  Rest as needed.  Go back to work when your fever is gone or your doctor says it is okay. ? You may need to stay home longer to avoid giving your URI to others. ? You can also wear a face mask and wash your hands often to prevent spread of the virus.  Use your inhaler more if you have asthma.  Do not use any tobacco products, including cigarettes, chewing tobacco, or electronic cigarettes. If you need help quitting, ask your doctor. Contact a doctor if:  You are getting worse, not better.  Your symptoms are not helped by  medicine.  You have chills.  You are getting more short of breath.  You have brown or red mucus.  You have yellow or brown discharge from your nose.  You have pain in your face, especially when you bend forward.  You have a fever.  You have puffy (swollen) neck glands.  You have pain while swallowing.  You have white areas in the back of your throat. Get help right away if:  You have very bad or constant: ? Headache. ? Ear pain. ? Pain in your forehead, behind your eyes, and over your cheekbones (sinus pain). ? Chest pain.  You have long-lasting (chronic) lung disease and any of the following: ? Wheezing. ? Long-lasting cough. ? Coughing up blood. ? A change in your usual mucus.  You have a stiff neck.  You have changes in your: ? Vision. ? Hearing. ? Thinking. ? Mood. This information is not intended to replace advice given to you by your health care provider. Make sure you discuss any questions you have with your health care provider. Document Released: 04/21/2008 Document Revised: 07/06/2016 Document Reviewed: 02/08/2014 Elsevier Interactive Patient Education  2018 [employer].

## 2017-12-23 NOTE — Progress Notes (Signed)
Patient ID: Samantha Eaton, female    DOB: 1961/02/21  Age: 57 y.o. MRN: [id number]  Chief Complaint  Patient presents with  . URI    Subjective:  Patient has been sick since last night and this morning.  She has rhinorrhea and a nonproductive cough.  She has some sore throat and feels like her tonsils are enlarged.  She had a little nausea yesterday but she attributed that to her recent cholecystectomy and warning what she can eat.  She has had a decreased appetite.  There are a lot of people sick at work, and she feels like she cannot afford to be sick and more to ward things off.   Current allergies, medications, problem list, past/family and social histories reviewed.  Objective:  BP 130/86 (BP Location: Right Arm, Patient Position: Sitting)   Pulse 76   Temp 97.8 F (36.6 C) (Tympanic)   SpO2 97%   Pleasant lady, sniffling a lot, no major distress.  TMs normal.  Nose congested.  Sinuses nontender but she has had some frontal and facial headache.  Throat has some edema of the soft tissues in the pharyngeal area with some streakiness from postnasal drainage.  Neck supple without significant nodes she is a little tender on the left side of the neck.  Chest is clear to auscultation.  Heart regular without murmurs.  Patient has had her flu shot.  Assessment & Plan:   Assessment: 1. Viral upper respiratory illness       Plan: Treat symptomatically.  No orders of the defined types were placed in this encounter.   Meds ordered this encounter  Medications  . ipratropium (ATROVENT) 0.03 % nasal spray    Sig: Place 2 sprays into both nostrils 3 (three) times daily.    Dispense:  30 mL    Refill:  0  . HYDROcodone-homatropine (HYCODAN) 5-1.5 MG/5ML syrup    Sig: Take 5 mL every 4-6 hours as needed for cough when you are not going to be working.  This is sedating.    Dispense:  120 mL    Refill:  0  . benzonatate (TESSALON) 100 MG capsule    Sig: Take 1-2 capsules (100-200 mg  total) by mouth 3 (three) times daily as needed for cough.    Dispense:  30 capsule    Refill:  0         Patient Instructions  Drink plenty of fluids and try to get enough rest  Use the Atrovent (ipratropium) nasal spray 2 sprays each nostril 3 times daily as needed for runny nose.  Take the benzonatate cough pills 1 or 2 pills 3 times daily for daytime cough (Tessalon).  This is nonsedating.  Hycodan cough syrup 1 teaspoon every 4-6 hours as needed for nighttime cough.  This is sedating.  Take over-the-counter DM-containing cough syrup such as Delsym, Robitussin-DM, or Mucinex DM if needed for bad cough.   You can take plain Mucinex if needed to thin secretions.  If head is too congested take Claritin-D or Allegra-D over-the-counter  If you develop worsening fevers, shortness of breath, worse cough, or generally are getting worse get rechecked.   Upper Respiratory Infection, Adult Most upper respiratory infections (URIs) are caused by a virus. A URI affects the nose, throat, and upper air passages. The most common type of URI is often called "the common cold." Follow these instructions at home:  Take medicines only as told by your doctor.  Gargle warm saltwater or take cough  drops to comfort your throat as told by your doctor.  Use a warm mist humidifier or inhale steam from a shower to increase air moisture. This may make it easier to breathe.  Drink enough fluid to keep your pee (urine) clear or pale yellow.  Eat soups and other clear broths.  Have a healthy diet.  Rest as needed.  Go back to work when your fever is gone or your doctor says it is okay. ? You may need to stay home longer to avoid giving your URI to others. ? You can also wear a face mask and wash your hands often to prevent spread of the virus.  Use your inhaler more if you have asthma.  Do not use any tobacco products, including cigarettes, chewing tobacco, or electronic cigarettes. If you need  help quitting, ask your doctor. Contact a doctor if:  You are getting worse, not better.  Your symptoms are not helped by medicine.  You have chills.  You are getting more short of breath.  You have brown or red mucus.  You have yellow or brown discharge from your nose.  You have pain in your face, especially when you bend forward.  You have a fever.  You have puffy (swollen) neck glands.  You have pain while swallowing.  You have white areas in the back of your throat. Get help right away if:  You have very bad or constant: ? Headache. ? Ear pain. ? Pain in your forehead, behind your eyes, and over your cheekbones (sinus pain). ? Chest pain.  You have long-lasting (chronic) lung disease and any of the following: ? Wheezing. ? Long-lasting cough. ? Coughing up blood. ? A change in your usual mucus.  You have a stiff neck.  You have changes in your: ? Vision. ? Hearing. ? Thinking. ? Mood. This information is not intended to replace advice given to you by your health care provider. Make sure you discuss any questions you have with your health care provider. Document Released: 04/21/2008 Document Revised: 07/06/2016 Document Reviewed: 02/08/2014 Elsevier Interactive Patient Education  [employer].     Return if symptoms worsen or fail to improve.   Lymon Kidney, MD 12/23/2017

## 2017-12-23 NOTE — Progress Notes (Signed)
Pt c/o rhinorrhea, non-prod cough, sore throat, nausea, decreased appetite since last night. Also feels like her tonsils are enlarged. Coworkers with similar sx.

## 2017-12-24 ENCOUNTER — Telehealth: Payer: Self-pay | Admitting: *Deleted

## 2017-12-24 NOTE — Telephone Encounter (Signed)
Patient states Anderson Malta was to send 90 days supply on all of her medications. Informed patient 90 days was sent so for her to call pharmacy. Pt states she will call.

## 2018-01-19 ENCOUNTER — Telehealth: Payer: Self-pay | Admitting: *Deleted

## 2018-01-19 MED ORDER — LEVOTHYROXINE SODIUM 50 MCG PO TABS: ORAL_TABLET | ORAL | 4 refills | Status: DC

## 2018-01-19 MED ORDER — SIMVASTATIN 20 MG PO TABS: 20.0000 mg | ORAL_TABLET | Freq: Every day | ORAL | 4 refills | Status: DC

## 2018-01-19 MED ORDER — AMLODIPINE BESYLATE 5 MG PO TABS: 5.0000 mg | ORAL_TABLET | Freq: Every day | ORAL | 4 refills | Status: DC

## 2018-01-19 MED ORDER — VALACYCLOVIR HCL 1 G PO TABS: 1000.0000 mg | ORAL_TABLET | Freq: Every day | ORAL | 1 refills | Status: DC | PRN

## 2018-01-19 MED ORDER — ESCITALOPRAM OXALATE 10 MG PO TABS: 10.0000 mg | ORAL_TABLET | Freq: Every day | ORAL | 4 refills | Status: DC

## 2018-01-19 MED ORDER — MELOXICAM 15 MG PO TABS: 15.0000 mg | ORAL_TABLET | Freq: Every day | ORAL | 3 refills | Status: DC

## 2018-01-19 MED ORDER — PRAMIPEXOLE DIHYDROCHLORIDE 0.5 MG PO TABS: 0.5000 mg | ORAL_TABLET | Freq: Two times a day (BID) | ORAL | 3 refills | Status: DC

## 2018-01-19 NOTE — Telephone Encounter (Signed)
[facility]

## 2018-02-01 ENCOUNTER — Other Ambulatory Visit: Payer: Self-pay | Admitting: Adult Health

## 2018-02-18 ENCOUNTER — Other Ambulatory Visit: Payer: Self-pay | Admitting: Family Medicine

## 2018-02-18 DIAGNOSIS — J069 Acute upper respiratory infection, unspecified: Secondary | ICD-10-CM

## 2018-02-18 NOTE — Telephone Encounter (Signed)
Patient called, left detailed VM to contact her PCP for additional refills of atrovent nasal spray.

## 2018-03-06 ENCOUNTER — Other Ambulatory Visit: Payer: Self-pay | Admitting: General Surgery

## 2018-03-11 ENCOUNTER — Telehealth: Payer: Self-pay | Admitting: Adult Health

## 2018-03-11 NOTE — Telephone Encounter (Signed)
Spoke with patient. She stated that she will try to find out the name of the pharmacy to send 90 day rx to. There is not a company in our [place] called [employer].

## 2018-03-12 ENCOUNTER — Telehealth: Payer: Self-pay | Admitting: *Deleted

## 2018-03-12 NOTE — Telephone Encounter (Signed)
Spoke with pt. Pharmacy is [phone]. Pt is needing a 90 day written prescription for Norvasc 5 mg, Lexapro 10 mg, Levothyroxine 50 mcg, Mobic 15 mg, Mirapex 0.5 mg BID, Zocor 20 mg so she can mail in. [employer]

## 2018-03-12 NOTE — Telephone Encounter (Signed)
Pt called back. She don't need hand written prescriptions. Just send to pharmacy. Thanks!! [place]

## 2018-03-15 MED ORDER — LEVOTHYROXINE SODIUM 50 MCG PO TABS: ORAL_TABLET | ORAL | 4 refills | Status: DC

## 2018-03-15 MED ORDER — PRAMIPEXOLE DIHYDROCHLORIDE 0.5 MG PO TABS: 0.5000 mg | ORAL_TABLET | Freq: Two times a day (BID) | ORAL | 3 refills | Status: DC

## 2018-03-15 MED ORDER — AMLODIPINE BESYLATE 5 MG PO TABS: 5.0000 mg | ORAL_TABLET | Freq: Every day | ORAL | 4 refills | Status: DC

## 2018-03-15 MED ORDER — ESCITALOPRAM OXALATE 10 MG PO TABS: 10.0000 mg | ORAL_TABLET | Freq: Every day | ORAL | 4 refills | Status: DC

## 2018-03-15 MED ORDER — MELOXICAM 15 MG PO TABS: 15.0000 mg | ORAL_TABLET | Freq: Every day | ORAL | 3 refills | Status: DC

## 2018-03-15 MED ORDER — SIMVASTATIN 20 MG PO TABS: 20.0000 mg | ORAL_TABLET | Freq: Every day | ORAL | 3 refills | Status: DC

## 2018-03-15 NOTE — Telephone Encounter (Signed)
Left message that meds refilled with 90 day supply and that I got form today and will mail out in am

## 2018-08-19 ENCOUNTER — Other Ambulatory Visit: Payer: Self-pay | Admitting: Internal Medicine

## 2018-08-19 DIAGNOSIS — Z1231 Encounter for screening mammogram for malignant neoplasm of breast: Secondary | ICD-10-CM

## 2018-10-04 ENCOUNTER — Ambulatory Visit
Admission: RE | Admit: 2018-10-04 | Discharge: 2018-10-04 | Disposition: A | Discharge status: Home / Self Care | Payer: Managed Care, Other (non HMO) | Source: Ambulatory Visit | Attending: Internal Medicine | Admitting: Internal Medicine

## 2018-10-04 DIAGNOSIS — Z1231 Encounter for screening mammogram for malignant neoplasm of breast: Secondary | ICD-10-CM

## 2018-12-23 ENCOUNTER — Encounter: Payer: Self-pay | Admitting: Adult Health

## 2018-12-23 ENCOUNTER — Ambulatory Visit (INDEPENDENT_AMBULATORY_CARE_PROVIDER_SITE_OTHER): Payer: Managed Care, Other (non HMO) | Admitting: Adult Health

## 2018-12-23 VITALS — BP 128/79 | HR 73 | Ht 64.0 in | Wt 225.5 lb

## 2018-12-23 DIAGNOSIS — Z1321 Encounter for screening for nutritional disorder: Secondary | ICD-10-CM | POA: Insufficient documentation

## 2018-12-23 DIAGNOSIS — Z1211 Encounter for screening for malignant neoplasm of colon: Secondary | ICD-10-CM

## 2018-12-23 DIAGNOSIS — F419 Anxiety disorder, unspecified: Secondary | ICD-10-CM

## 2018-12-23 DIAGNOSIS — E7800 Pure hypercholesterolemia, unspecified: Secondary | ICD-10-CM

## 2018-12-23 DIAGNOSIS — Z01419 Encounter for gynecological examination (general) (routine) without abnormal findings: Secondary | ICD-10-CM | POA: Diagnosis not present

## 2018-12-23 DIAGNOSIS — I10 Essential (primary) hypertension: Secondary | ICD-10-CM

## 2018-12-23 DIAGNOSIS — E038 Other specified hypothyroidism: Secondary | ICD-10-CM

## 2018-12-23 DIAGNOSIS — Z1212 Encounter for screening for malignant neoplasm of rectum: Secondary | ICD-10-CM

## 2018-12-23 DIAGNOSIS — K649 Unspecified hemorrhoids: Secondary | ICD-10-CM | POA: Insufficient documentation

## 2018-12-23 DIAGNOSIS — Z131 Encounter for screening for diabetes mellitus: Secondary | ICD-10-CM | POA: Insufficient documentation

## 2018-12-23 LAB — HEMOCCULT GUIAC POC 1CARD (OFFICE): Fecal Occult Blood, POC: NEGATIVE

## 2018-12-23 MED ORDER — SIMVASTATIN 20 MG PO TABS: 20.0000 mg | ORAL_TABLET | Freq: Every day | ORAL | 4 refills | Status: DC

## 2018-12-23 MED ORDER — PRAMIPEXOLE DIHYDROCHLORIDE 0.5 MG PO TABS: ORAL_TABLET | ORAL | 4 refills | Status: DC

## 2018-12-23 MED ORDER — MELOXICAM 15 MG PO TABS: 15.0000 mg | ORAL_TABLET | Freq: Every day | ORAL | 4 refills | Status: DC

## 2018-12-23 MED ORDER — ESCITALOPRAM OXALATE 10 MG PO TABS: 10.0000 mg | ORAL_TABLET | Freq: Every day | ORAL | 4 refills | Status: DC

## 2018-12-23 MED ORDER — LEVOTHYROXINE SODIUM 50 MCG PO TABS: ORAL_TABLET | ORAL | 4 refills | Status: DC

## 2018-12-23 MED ORDER — HYDROCORTISONE ACE-PRAMOXINE 2.5-1 % RE CREA: 1.0000 "application " | TOPICAL_CREAM | Freq: Three times a day (TID) | RECTAL | 4 refills | Status: DC

## 2018-12-23 MED ORDER — AMLODIPINE BESYLATE 5 MG PO TABS: 5.0000 mg | ORAL_TABLET | Freq: Every day | ORAL | 4 refills | Status: DC

## 2018-12-23 NOTE — Progress Notes (Signed)
Patient ID: Samantha Eaton, female   DOB: [birth year], 58 y.o.   MRN: [id number] History of Present Illness: Samantha Eaton is a 58 year old white female, married, sp hysterectomy in for well woman gyn exam. She is complaining of hemorrhoids, they itch and bleed at times.  She works with [employer] in [place], and sees their PA/NP at their clinic.  PCP is Dr Willey Blade.   Current Medications, Allergies, Past Medical History, Past Surgical History, Family History and Social History were reviewed in [employer] record.     Review of Systems: Patient denies any headaches, hearing loss, fatigue, blurred vision, shortness of breath, chest pain, abdominal pain, problems with bowel movements, urination, or intercourse. No joint pain or mood swings. +hemorrhoids that itch and bleed at times.  Has OA pain in neck and shoulders, is on Mobic, consider seeing rheumatologist Saw Dr Willey Blade recently for legs aching.    Physical Exam:BP 128/79 (BP Location: Left Arm, Patient Position: Sitting, Cuff Size: Large)   Pulse 73   Ht 5\' 4"  (1.626 m)   Wt 225 lb 8 oz (102.3 kg)   BMI 38.71 kg/m  General:  Well developed, well nourished, no acute distress Skin:  Warm and dry Neck:  Midline trachea, normal thyroid, good ROM, no lymphadenopathy Lungs; Clear to auscultation bilaterally Breast:  No dominant palpable mass, retraction, or nipple discharge Cardiovascular: Regular rate and rhythm Abdomen:  Soft, non tender, no hepatosplenomegaly Pelvic:  External genitalia is normal in appearance, no lesions.  The vagina is normal in appearance. Urethra has no lesions or masses. The cervix and uterus are absent.  No adnexal masses or tenderness noted.Bladder is non tender, no masses felt. Rectal: Good sphincter tone, no polyps, + hemorrhoids felt.  Hemoccult negative. Extremities/musculoskeletal:  No swelling or varicosities noted, no clubbing or cyanosis Psych:  No mood changes, alert and  cooperative,seems happy Fall risk is low. PHQ 2 score 0. Pt gave verbal consent for exam without chaperone.  She requests fasting labs today. Will continue meds, refills sent, and will Rx analpram [facility] for hemorrhoids, if decides wants them removed, can call Dr Arnoldo Morale, he did GB.   Impression: 1. Well woman exam with routine gynecological exam   2. Screening for colorectal cancer   3. Hemorrhoids, unspecified hemorrhoid type   4. Anxiety   5. Essential hypertension   6. Elevated cholesterol   7. Other specified hypothyroidism   8. Screening for diabetes mellitus   9. Encounter for vitamin deficiency screening       Plan: Check CBC,CMP,TSH and lipids,A1c and vitamin D Meds ordered this encounter  Medications  . pramipexole (MIRAPEX) 0.5 MG tablet    Sig: Take 1 bid    Dispense:  180 tablet    Refill:  4    Order Specific Question:   Supervising Provider    Answer:   Elonda Husky, LUTHER H [[id number]]  . simvastatin (ZOCOR) 20 MG tablet    Sig: Take 1 tablet (20 mg total) by mouth daily.    Dispense:  90 tablet    Refill:  4    Order Specific Question:   Supervising Provider    Answer:   Elonda Husky, LUTHER H [[id number]]  . meloxicam (MOBIC) 15 MG tablet    Sig: Take 1 tablet (15 mg total) by mouth daily.    Dispense:  90 tablet    Refill:  4    Order Specific Question:   Supervising Provider    Answer:  EURE, LUTHER H [[id number]]  . levothyroxine (SYNTHROID, LEVOTHROID) 50 MCG tablet    Sig: TAKE ONE (1) TABLET EACH DAY    Dispense:  90 tablet    Refill:  4    Order Specific Question:   Supervising Provider    Answer:   Elonda Husky, LUTHER H [[id number]]  . escitalopram (LEXAPRO) 10 MG tablet    Sig: Take 1 tablet (10 mg total) by mouth daily.    Dispense:  90 tablet    Refill:  4    Order Specific Question:   Supervising Provider    Answer:   Elonda Husky, LUTHER H [[id number]]  . amLODipine (NORVASC) 5 MG tablet    Sig: Take 1 tablet (5 mg total) by mouth daily.    Dispense:  90 tablet    Refill:  4    Order  Specific Question:   Supervising Provider    Answer:   Elonda Husky, LUTHER H [[id number]]  . hydrocortisone-pramoxine (ANALPRAM-HC) 2.5-1 % rectal cream    Sig: Place 1 application rectally 3 (three) times daily.    Dispense:  30 g    Refill:  4    Order Specific Question:   Supervising Provider    Answer:   Florian Buff [[id number]]  Physical in 1 year Mammogram yearly Colonoscopy per GI

## 2018-12-24 ENCOUNTER — Telehealth: Payer: Self-pay | Admitting: Adult Health

## 2018-12-24 LAB — LIPID PANEL
CHOL/HDL RATIO: 3.4 ratio (ref 0.0–4.4)
CHOLESTEROL TOTAL: 170 mg/dL (ref 100–199)
HDL: 50 mg/dL (ref >39)
LDL CALC: 94 mg/dL (ref 0–99)
Triglycerides: 128 mg/dL (ref 0–149)
VLDL CHOLESTEROL CAL: 26 mg/dL (ref 5–40)

## 2018-12-24 LAB — COMPREHENSIVE METABOLIC PANEL
ALBUMIN: 4.4 g/dL (ref 3.8–4.9)
ALK PHOS: 68 IU/L (ref 39–117)
ALT: 20 IU/L (ref 0–32)
AST: 19 IU/L (ref 0–40)
Albumin/Globulin Ratio: 1.8 (ref 1.2–2.2)
BUN / CREAT RATIO: 25 — AB (ref 9–23)
BUN: 22 mg/dL (ref 6–24)
CO2: 21 mmol/L (ref 20–29)
CREATININE: 0.88 mg/dL (ref 0.57–1.00)
Calcium: 9.6 mg/dL (ref 8.7–10.2)
Chloride: 104 mmol/L (ref 96–106)
GFR calc Af Amer: 84 mL/min/{1.73_m2} (ref >59)
GFR calc non Af Amer: 73 mL/min/{1.73_m2} (ref >59)
GLUCOSE: 90 mg/dL (ref 65–99)
Globulin, Total: 2.5 g/dL (ref 1.5–4.5)
Potassium: 4.7 mmol/L (ref 3.5–5.2)
Sodium: 141 mmol/L (ref 134–144)
Total Protein: 6.9 g/dL (ref 6.0–8.5)

## 2018-12-24 LAB — HEMOGLOBIN A1C
ESTIMATED AVERAGE GLUCOSE: 128 mg/dL
HEMOGLOBIN A1C: 6.1 % — AB (ref 4.8–5.6)

## 2018-12-24 LAB — CBC
HEMOGLOBIN: 11.5 g/dL (ref 11.1–15.9)
Hematocrit: 33.3 % — ABNORMAL LOW (ref 34.0–46.6)
MCH: 30.6 pg (ref 26.6–33.0)
MCHC: 34.5 g/dL (ref 31.5–35.7)
MCV: 89 fL (ref 79–97)
PLATELETS: 580 10*3/uL — AB (ref 150–450)
RBC: 3.76 x10E6/uL — ABNORMAL LOW (ref 3.77–5.28)
RDW: 13.3 % (ref 11.7–15.4)
WBC: 12.1 10*3/uL — ABNORMAL HIGH (ref 3.4–10.8)

## 2018-12-24 LAB — TSH: TSH: 1.64 u[IU]/mL (ref 0.450–4.500)

## 2018-12-24 LAB — VITAMIN D 25 HYDROXY (VIT D DEFICIENCY, FRACTURES): VIT D 25 HYDROXY: 26.2 ng/mL — AB (ref 30.0–100.0)

## 2018-12-24 MED ORDER — CHOLECALCIFEROL 125 MCG (5000 UT) PO CAPS: 5000.0000 [IU] | ORAL_CAPSULE | Freq: Every day | ORAL | Status: DC

## 2018-12-24 NOTE — Telephone Encounter (Signed)
Pt aware of labs take vitamin D 3 5000 IU daily and decrease carbs and increase activity

## 2019-01-18 ENCOUNTER — Telehealth: Payer: Self-pay | Admitting: Adult Health

## 2019-01-18 DIAGNOSIS — M47812 Spondylosis without myelopathy or radiculopathy, cervical region: Secondary | ICD-10-CM

## 2019-01-18 DIAGNOSIS — R52 Pain, unspecified: Secondary | ICD-10-CM

## 2019-01-18 NOTE — Telephone Encounter (Signed)
PT stated when she was here to see Danise Mina she was talking to her about seeing a Rheumatologist she would like to see Dr Lenna Gilford fax # [phone].  Please advise pt

## 2019-01-19 NOTE — Telephone Encounter (Signed)
Dr Trudie Reed [facility]  Will refer to Dr Trudie Reed

## 2019-01-19 NOTE — Addendum Note (Signed)
Addended by: Derrek Monaco A on: 01/19/2019 04:52 PM   Modules accepted: Orders

## 2019-10-21 ENCOUNTER — Other Ambulatory Visit: Payer: Self-pay | Admitting: Internal Medicine

## 2019-10-21 DIAGNOSIS — Z1231 Encounter for screening mammogram for malignant neoplasm of breast: Secondary | ICD-10-CM

## 2019-11-18 HISTORY — PX: REPLACEMENT TOTAL KNEE: SUR1224

## 2019-12-12 ENCOUNTER — Ambulatory Visit
Admission: RE | Admit: 2019-12-12 | Discharge: 2019-12-12 | Disposition: A | Discharge status: Home / Self Care | Payer: Managed Care, Other (non HMO) | Source: Ambulatory Visit | Attending: Internal Medicine | Admitting: Internal Medicine

## 2019-12-12 ENCOUNTER — Other Ambulatory Visit: Payer: Self-pay

## 2019-12-12 DIAGNOSIS — Z1231 Encounter for screening mammogram for malignant neoplasm of breast: Secondary | ICD-10-CM

## 2020-01-09 ENCOUNTER — Other Ambulatory Visit: Payer: Self-pay | Admitting: Adult Health

## 2020-01-30 ENCOUNTER — Other Ambulatory Visit: Payer: Self-pay | Admitting: Adult Health

## 2020-07-04 ENCOUNTER — Telehealth: Payer: Self-pay | Admitting: Adult Health

## 2020-07-04 NOTE — Telephone Encounter (Signed)
Patient called stating that she had to switch pharmacy and she needs jennifer to resend her medication to [facility]. Pt states that she leave on vacation on Friday and she has run out. Please contact pt

## 2020-07-04 NOTE — Addendum Note (Signed)
Addended by: Janece Canterbury on: 07/04/2020 03:23 PM   Modules accepted: Orders

## 2020-07-04 NOTE — Telephone Encounter (Signed)
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 Needs rx to [facility]. Patient going out of town and would like to pick up meds tomorrow.

## 2020-07-05 MED ORDER — SIMVASTATIN 20 MG PO TABS: 20.0000 mg | ORAL_TABLET | Freq: Every day | ORAL | 4 refills | Status: DC

## 2020-07-05 MED ORDER — PRAMIPEXOLE DIHYDROCHLORIDE 0.5 MG PO TABS: ORAL_TABLET | ORAL | 4 refills | Status: DC

## 2020-07-05 MED ORDER — AMLODIPINE BESYLATE 5 MG PO TABS: 5.0000 mg | ORAL_TABLET | Freq: Every day | ORAL | 4 refills | Status: DC

## 2020-07-05 MED ORDER — ESCITALOPRAM OXALATE 10 MG PO TABS: 10.0000 mg | ORAL_TABLET | Freq: Every day | ORAL | 4 refills | Status: DC

## 2020-07-05 NOTE — Addendum Note (Signed)
Addended by: Derrek Monaco A on: 07/05/2020 08:42 AM   Modules accepted: Orders

## 2020-07-05 NOTE — Telephone Encounter (Signed)
meds refilled to [facility]

## 2020-12-21 ENCOUNTER — Other Ambulatory Visit: Payer: Self-pay | Admitting: Adult Health

## 2021-01-04 ENCOUNTER — Other Ambulatory Visit: Payer: Self-pay | Admitting: Internal Medicine

## 2021-01-04 DIAGNOSIS — Z1231 Encounter for screening mammogram for malignant neoplasm of breast: Secondary | ICD-10-CM

## 2021-02-22 ENCOUNTER — Inpatient Hospital Stay: Admission: RE | Admit: 2021-02-22 | Payer: Managed Care, Other (non HMO) | Source: Ambulatory Visit

## 2021-05-10 ENCOUNTER — Other Ambulatory Visit: Payer: Self-pay

## 2021-05-10 ENCOUNTER — Ambulatory Visit
Admission: RE | Admit: 2021-05-10 | Discharge: 2021-05-10 | Disposition: A | Discharge status: Home / Self Care | Payer: PRIVATE HEALTH INSURANCE | Source: Ambulatory Visit | Attending: Internal Medicine | Admitting: Internal Medicine

## 2021-05-10 DIAGNOSIS — Z1231 Encounter for screening mammogram for malignant neoplasm of breast: Secondary | ICD-10-CM

## 2021-05-21 ENCOUNTER — Telehealth: Payer: Self-pay | Admitting: Adult Health

## 2021-05-21 MED ORDER — LEVOTHYROXINE SODIUM 50 MCG PO TABS: ORAL_TABLET | ORAL | 4 refills | Status: DC

## 2021-05-21 NOTE — Telephone Encounter (Signed)
Pt is requesting a 10 day supply of thyroid med be sent to local pharmacy and then 90 day supply sent to [facility]. Please advise. Thanks!! [place]

## 2021-05-21 NOTE — Telephone Encounter (Signed)
Patient calling stating that she had all of her medications sent to [facility] but there was script that was missing the levothyroxine 50 mcg and she is out. Patient is asking for the script to be as soon as possible

## 2021-05-21 NOTE — Addendum Note (Signed)
Addended by: Derrek Monaco A on: 05/21/2021 11:03 AM   Modules accepted: Orders

## 2021-05-21 NOTE — Telephone Encounter (Signed)
Ilani aware needs appt and labs, but will refill synthroid for 30 days

## 2021-06-10 ENCOUNTER — Other Ambulatory Visit (HOSPITAL_BASED_OUTPATIENT_CLINIC_OR_DEPARTMENT_OTHER): Payer: Self-pay

## 2021-06-10 DIAGNOSIS — R0683 Snoring: Secondary | ICD-10-CM

## 2021-06-10 DIAGNOSIS — G4730 Sleep apnea, unspecified: Secondary | ICD-10-CM

## 2021-06-13 ENCOUNTER — Other Ambulatory Visit: Payer: Self-pay

## 2021-06-13 ENCOUNTER — Ambulatory Visit (INDEPENDENT_AMBULATORY_CARE_PROVIDER_SITE_OTHER): Payer: PRIVATE HEALTH INSURANCE | Admitting: Adult Health

## 2021-06-13 ENCOUNTER — Encounter: Payer: Self-pay | Admitting: Adult Health

## 2021-06-13 VITALS — BP 116/81 | HR 74 | Ht 63.25 in | Wt 214.5 lb

## 2021-06-13 DIAGNOSIS — Z131 Encounter for screening for diabetes mellitus: Secondary | ICD-10-CM

## 2021-06-13 DIAGNOSIS — K649 Unspecified hemorrhoids: Secondary | ICD-10-CM

## 2021-06-13 DIAGNOSIS — E039 Hypothyroidism, unspecified: Secondary | ICD-10-CM | POA: Diagnosis not present

## 2021-06-13 DIAGNOSIS — E782 Mixed hyperlipidemia: Secondary | ICD-10-CM

## 2021-06-13 DIAGNOSIS — F419 Anxiety disorder, unspecified: Secondary | ICD-10-CM

## 2021-06-13 DIAGNOSIS — Z1211 Encounter for screening for malignant neoplasm of colon: Secondary | ICD-10-CM

## 2021-06-13 DIAGNOSIS — Z01419 Encounter for gynecological examination (general) (routine) without abnormal findings: Secondary | ICD-10-CM

## 2021-06-13 DIAGNOSIS — G2581 Restless legs syndrome: Secondary | ICD-10-CM

## 2021-06-13 DIAGNOSIS — I10 Essential (primary) hypertension: Secondary | ICD-10-CM

## 2021-06-13 LAB — HEMOCCULT GUIAC POC 1CARD (OFFICE): Fecal Occult Blood, POC: NEGATIVE

## 2021-06-13 MED ORDER — SIMVASTATIN 20 MG PO TABS: 20.0000 mg | ORAL_TABLET | Freq: Every day | ORAL | 4 refills | Status: DC

## 2021-06-13 MED ORDER — AMLODIPINE BESYLATE 5 MG PO TABS: 5.0000 mg | ORAL_TABLET | Freq: Every day | ORAL | 4 refills | Status: DC

## 2021-06-13 MED ORDER — ESCITALOPRAM OXALATE 10 MG PO TABS: 10.0000 mg | ORAL_TABLET | Freq: Every day | ORAL | 4 refills | Status: DC

## 2021-06-13 MED ORDER — HYDROCORT-PRAMOXINE (PERIANAL) 2.5-1 % EX CREA: 1.0000 "application " | TOPICAL_CREAM | Freq: Three times a day (TID) | CUTANEOUS | 2 refills | Status: DC

## 2021-06-13 MED ORDER — LEVOTHYROXINE SODIUM 50 MCG PO TABS: ORAL_TABLET | ORAL | 4 refills | Status: DC

## 2021-06-13 MED ORDER — PRAMIPEXOLE DIHYDROCHLORIDE 0.5 MG PO TABS: ORAL_TABLET | ORAL | 4 refills | Status: DC

## 2021-06-13 MED ORDER — VALACYCLOVIR HCL 1 G PO TABS: 1000.0000 mg | ORAL_TABLET | Freq: Every day | ORAL | 1 refills | Status: DC | PRN

## 2021-06-13 NOTE — Progress Notes (Signed)
Patient ID: Samantha Eaton, female   DOB: 08-18-1961, 60 y.o.   MRN: [id number] History of Present Illness:  Samantha Eaton is a 60 year old white female,married, sp hysterectomy, in for a well woman gyn exam. 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  She requests labs today. PCP is Dr Willey Blade    Current Medications, Allergies, Past Medical History, Past Surgical History, Family History and Social History were reviewed in [place] record.     Review of Systems:  Patient denies any headaches, hearing loss, fatigue, blurred vision, shortness of breath, chest pain, abdominal pain, problems with bowel movements, urination, or intercourse.(Not active). No joint pain or mood swings.  Has hemorrhoids, legs ache at end of the day   Physical Exam:BP 116/81 (BP Location: Left Arm, Patient Position: Sitting, Cuff Size: Large)   Pulse 74   Ht 5' 3.25" (1.607 m)   Wt 214 lb 8 oz (97.3 kg)   BMI 37.70 kg/m   General:  Well developed, well nourished, no acute distress Skin:  Warm and dry,tan Neck:  Midline trachea, normal thyroid, good ROM, no lymphadenopathy,no carotid bruits heard Lungs; Clear to auscultation bilaterally Breast:  No dominant palpable mass, retraction, or nipple discharge Cardiovascular: Regular rate and rhythm Abdomen:  Soft, non tender, no hepatosplenomegaly Pelvic:  External genitalia is normal in appearance, no lesions.  The vagina is normal in appearance. Urethra has no lesions or masses. The cervix and uterus are absent. No adnexal masses or tenderness noted.Bladder is non tender, no masses felt. Rectal: Good sphincter tone, no polyps, + hemorrhoids felt.  Hemoccult negative. Extremities/musculoskeletal:  No swelling or varicosities noted, no clubbing or cyanosis Psych:  No mood changes, alert and cooperative,seems happy AA is 2 Fall risk is  low Depression screen [facility] 2/9 06/13/2021 12/23/2018 11/26/2017  Decreased Interest 0 0 0  Down, Depressed, Hopeless 1 0 1  PHQ - 2 Score 1 0 1  Altered sleeping 1 - -  Tired, decreased energy 1 - -  Change in appetite 0 - -  Feeling bad or failure about yourself  0 - -  Trouble concentrating 1 - -  Moving slowly or fidgety/restless 0 - -  Suicidal thoughts 0 - -  PHQ-9 Score 4 - -    GAD 7 : Generalized Anxiety Score 06/13/2021  Nervous, Anxious, on Edge 1  Control/stop worrying 1  Worry too much - different things 1  Trouble relaxing 0  Restless 0  Easily annoyed or irritable 0  Afraid - awful might happen 0  Total GAD 7 Score 3      Upstream - 06/13/21 0859       Pregnancy Intention Screening   Does the patient want to become pregnant in the next year? N/A    Does the patient's partner want to become pregnant in the next year? N/A    Would the patient like to discuss contraceptive options today? N/A      Contraception Wrap Up   Current Method Female Sterilization   hyst   End Method Female Sterilization   hyst   Contraception Counseling Provided No            Examination chaperoned by Levy Pupa LPN  Impression and Plan: 1. Well woman exam with routine gynecological exam Physical in 1 year Mammogram yearly Colonoscopy per GI - CBC - Comprehensive  metabolic panel  2. Hemorrhoids, unspecified hemorrhoid type Will rx analpram HC Try to push in at night   Meds ordered this encounter  Medications   hydrocortisone-pramoxine (ANALPRAM-HC) 2.5-1 % rectal cream    Sig: Place 1 application rectally 3 (three) times daily.    Dispense:  30 g    Refill:  2    Order Specific Question:   Supervising Provider    Answer:   Elonda Husky, LUTHER H [[id number]]   amLODipine (NORVASC) 5 MG tablet    Sig: Take 1 tablet (5 mg total) by mouth daily.    Dispense:  90 tablet    Refill:  4    Order Specific Question:   Supervising Provider    Answer:   Tania Ade H [[id number]]   escitalopram  (LEXAPRO) 10 MG tablet    Sig: Take 1 tablet (10 mg total) by mouth daily.    Dispense:  90 tablet    Refill:  4    Order Specific Question:   Supervising Provider    Answer:   Elonda Husky, LUTHER H [[id number]]   pramipexole (MIRAPEX) 0.5 MG tablet    Sig: TAKE 1 TABLET BY MOUTH TWICE DAILY    Dispense:  180 tablet    Refill:  4    Order Specific Question:   Supervising Provider    Answer:   Tania Ade H [[id number]]   simvastatin (ZOCOR) 20 MG tablet    Sig: Take 1 tablet (20 mg total) by mouth daily.    Dispense:  90 tablet    Refill:  4    Order Specific Question:   Supervising Provider    Answer:   Tania Ade H [[id number]]   valACYclovir (VALTREX) 1000 MG tablet    Sig: Take 1 tablet (1,000 mg total) by mouth daily as needed (fever blister). Take 2 then 2 next day for fever blisters prn    Dispense:  30 tablet    Refill:  1    Order Specific Question:   Supervising Provider    Answer:   Florian Buff [[id number]]   levothyroxine (SYNTHROID) 50 MCG tablet    Sig: TAKE ONE (1) TABLET EACH DAY    Dispense:  90 tablet    Refill:  4    Order Specific Question:   Supervising Provider    Answer:   Elonda Husky, LUTHER H [[id number]]     3. Encounter for screening fecal occult blood testing  - POCT occult blood stool  4. Hypothyroidism, unspecified type Continue synthroid - TSH  5. Essential hypertension Continue norvasc  6. RLS (restless legs syndrome) Continue mirapex  7. Anxiety Continue lexapro  8. Mixed hyperlipidemia Continue zocor - Lipid panel  9. Screening for diabetes mellitus  - Hemoglobin A1c

## 2021-06-13 NOTE — Patient Instructions (Signed)
Thank you for choosing our office today! We appreciate the opportunity to meet your healthcare needs. You may receive a short survey by e-mail or through MyChart. If you are happy with your care we would appreciate if you could take just a few minutes to complete the survey questions. We read all of your comments and take your feedback very seriously. Thank you again for choosing our office.  Branden Vine   [facility] Team  

## 2021-06-14 ENCOUNTER — Ambulatory Visit: Payer: PRIVATE HEALTH INSURANCE | Attending: Internal Medicine | Admitting: Neurology

## 2021-06-14 DIAGNOSIS — G4730 Sleep apnea, unspecified: Secondary | ICD-10-CM

## 2021-06-14 DIAGNOSIS — G4733 Obstructive sleep apnea (adult) (pediatric): Secondary | ICD-10-CM | POA: Insufficient documentation

## 2021-06-14 DIAGNOSIS — R0683 Snoring: Secondary | ICD-10-CM

## 2021-06-14 LAB — COMPREHENSIVE METABOLIC PANEL
ALT: 16 IU/L (ref 0–32)
AST: 20 IU/L (ref 0–40)
Albumin/Globulin Ratio: 1.5 (ref 1.2–2.2)
Albumin: 4.4 g/dL (ref 3.8–4.9)
Alkaline Phosphatase: 70 IU/L (ref 44–121)
BUN/Creatinine Ratio: 25 (ref 12–28)
BUN: 21 mg/dL (ref 8–27)
Bilirubin Total: 0.3 mg/dL (ref 0.0–1.2)
CO2: 23 mmol/L (ref 20–29)
Calcium: 9.7 mg/dL (ref 8.7–10.3)
Chloride: 103 mmol/L (ref 96–106)
Creatinine, Ser: 0.85 mg/dL (ref 0.57–1.00)
Globulin, Total: 3 g/dL (ref 1.5–4.5)
Glucose: 92 mg/dL (ref 65–99)
Potassium: 4.6 mmol/L (ref 3.5–5.2)
Sodium: 141 mmol/L (ref 134–144)
Total Protein: 7.4 g/dL (ref 6.0–8.5)
eGFR: 78 mL/min/{1.73_m2} (ref >59)

## 2021-06-14 LAB — LIPID PANEL
Chol/HDL Ratio: 3.1 ratio (ref 0.0–4.4)
Cholesterol, Total: 170 mg/dL (ref 100–199)
HDL: 55 mg/dL (ref >39)
LDL Chol Calc (NIH): 94 mg/dL (ref 0–99)
Triglycerides: 120 mg/dL (ref 0–149)
VLDL Cholesterol Cal: 21 mg/dL (ref 5–40)

## 2021-06-14 LAB — HEMOGLOBIN A1C
Est. average glucose Bld gHb Est-mCnc: 120 mg/dL
Hgb A1c MFr Bld: 5.8 % — ABNORMAL HIGH (ref 4.8–5.6)

## 2021-06-14 LAB — CBC
Hematocrit: 34.8 % (ref 34.0–46.6)
Hemoglobin: 11.8 g/dL (ref 11.1–15.9)
MCH: 30.4 pg (ref 26.6–33.0)
MCHC: 33.9 g/dL (ref 31.5–35.7)
MCV: 90 fL (ref 79–97)
Platelets: 471 10*3/uL — ABNORMAL HIGH (ref 150–450)
RBC: 3.88 x10E6/uL (ref 3.77–5.28)
RDW: 12.9 % (ref 11.7–15.4)
WBC: 11.5 10*3/uL — ABNORMAL HIGH (ref 3.4–10.8)

## 2021-06-14 LAB — TSH: TSH: 5.54 u[IU]/mL — ABNORMAL HIGH (ref 0.450–4.500)

## 2021-06-17 ENCOUNTER — Telehealth: Payer: Self-pay | Admitting: Adult Health

## 2021-06-17 MED ORDER — LEVOTHYROXINE SODIUM 50 MCG PO TABS: ORAL_TABLET | ORAL | 0 refills | Status: DC

## 2021-06-17 NOTE — Telephone Encounter (Signed)
Pt aware of labs, had splenectomy so platelets run a little high, TSH elevated but had missed meds for about 2 weeks, taking now. A1c 5.8 best it has been in years, has lost some weight and retired.

## 2021-06-24 NOTE — Procedures (Signed)
Hurley A. Merlene Laughter, MD     [email or web address]             NOCTURNAL POLYSOMNOGRAPHY   LOCATION: [facility]  Patient Name: Samantha Eaton, Samantha Eaton Date: 06/14/2021 Gender: Female D.O.B: Feb 26, 1961 Age (years): 60 Referring Provider: Asencion Noble Height (inches): 61 Interpreting Physician: Phillips Odor MD, ABSM Weight (lbs): 208 RPSGT: Peak, Robert BMI: 36 MRN: [id number] Neck Size: 15.50 CLINICAL INFORMATION Sleep Study Type: Split Night CPAP  Indication for sleep study: OSA, Snoring  Epworth Sleepiness Score: N/A  SLEEP STUDY TECHNIQUE As per the AASM Manual for the Scoring of Sleep and Associated Events v2.3 (April 2016) with a hypopnea requiring 4% desaturations.  The channels recorded and monitored were frontal, central and occipital EEG, electrooculogram (EOG), submentalis EMG (chin), nasal and oral airflow, thoracic and abdominal wall motion, anterior tibialis EMG, snore microphone, electrocardiogram, and pulse oximetry. Continuous positive airway pressure (CPAP) was initiated when the patient met split night criteria and was titrated according to treat sleep-disordered breathing.  MEDICATIONS Medications self-administered by patient taken the night of the study : N/A  Current Outpatient Medications:    acetaminophen (TYLENOL) 500 MG tablet, Take 1,000 mg by mouth every 8 (eight) hours as needed., Disp: , Rfl:    amLODipine (NORVASC) 5 MG tablet, Take 1 tablet (5 mg total) by mouth daily., Disp: 90 tablet, Rfl: 4   cetirizine (ZYRTEC) 10 MG tablet, Take 10 mg by mouth daily., Disp: , Rfl:    diclofenac (VOLTAREN) 75 MG EC tablet, Take by mouth., Disp: , Rfl:    escitalopram (LEXAPRO) 10 MG tablet, Take 1 tablet (10 mg total) by mouth daily., Disp: 90 tablet, Rfl: 4   hydrocortisone-pramoxine (ANALPRAM-HC) 2.5-1 % rectal cream, Place 1 application rectally 3 (three) times daily., Disp: 30 g, Rfl: 2   hydroxychloroquine (PLAQUENIL) 200 MG  tablet, Take 200 mg by mouth 2 (two) times daily., Disp: , Rfl:    levothyroxine (SYNTHROID) 50 MCG tablet, TAKE ONE (1) TABLET EACH DAY, Disp: 30 tablet, Rfl: 0   pramipexole (MIRAPEX) 0.5 MG tablet, TAKE 1 TABLET BY MOUTH TWICE DAILY, Disp: 180 tablet, Rfl: 4   simvastatin (ZOCOR) 20 MG tablet, Take 1 tablet (20 mg total) by mouth daily., Disp: 90 tablet, Rfl: 4   valACYclovir (VALTREX) 1000 MG tablet, Take 1 tablet (1,000 mg total) by mouth daily as needed (fever blister). Take 2 then 2 next day for fever blisters prn, Disp: 30 tablet, Rfl: 1   RESPIRATORY PARAMETERS Diagnostic  Total AHI (/hr): 24.1 RDI (/hr): 32.8 OA Index (/hr): 1.9 CA Index (/hr): 0.0 REM AHI (/hr): 24.4 NREM AHI (/hr): 24.1 Supine AHI (/hr): 20.9 Non-supine AHI (/hr): 24.4 Min O2 Sat (%): 85.00 Mean O2 (%): 92.25 Time below 88% (min): 4.5   Titration  Optimal Pressure (cm): 10 AHI at Optimal Pressure (/hr): 0 Min O2 at Optimal Pressure (%): 90.0 Supine % at Optimal (%): 16 Sleep % at Optimal (%): 93   SLEEP ARCHITECTURE The recording time for the entire night was 420.8 minutes.  During a baseline period of 233.3 minutes, the patient slept for 194.0 minutes in REM and nonREM, yielding a sleep efficiency of 83.2%. Sleep onset after lights out was 0.2 minutes with a REM latency of 112.0 minutes. The patient spent 5.93% of the night in stage N1 sleep, 78.61% in stage N2 sleep, 1.55% in stage N3 and 13.9% in REM.  During the titration period of 185.7 minutes, the patient slept for 168.5 minutes in  REM and nonREM, yielding a sleep efficiency of 90.7%. Sleep onset after CPAP initiation was 3.2 minutes with a REM latency of 72.0 minutes. The patient spent 4.15% of the night in stage N1 sleep, 61.13% in stage N2 sleep, 1.78% in stage N3 and 32.9% in REM.  CARDIAC DATA The 2 lead EKG demonstrated sinus rhythm. The mean heart rate was 64.84 beats per minute. Other EKG findings include: None.  LEG MOVEMENT DATA The total  Periodic Limb Movements of Sleep (PLMS) were 0. The PLMS index was 0.00.  IMPRESSIONS Moderate obstructive sleep apnea occurred during the diagnostic portion of the study(AHI = 24.1/hour). The optimal CPAP selected for this patient is ( 10 cm of water)   Delano Metz, MD Diplomate, American Board of Sleep Medicine.  ELECTRONICALLY SIGNED ON:  06/24/2021, 9:08 AM [place] PH: [phone]   FX: [phone] [place]

## 2021-08-21 ENCOUNTER — Encounter: Payer: Self-pay | Admitting: Pulmonary Disease

## 2021-08-21 ENCOUNTER — Ambulatory Visit (INDEPENDENT_AMBULATORY_CARE_PROVIDER_SITE_OTHER): Payer: PRIVATE HEALTH INSURANCE | Admitting: Pulmonary Disease

## 2021-08-21 ENCOUNTER — Other Ambulatory Visit: Payer: Self-pay

## 2021-08-21 VITALS — BP 128/72 | HR 82 | Temp 98.4°F | Ht 64.5 in | Wt 219.1 lb

## 2021-08-21 DIAGNOSIS — G4733 Obstructive sleep apnea (adult) (pediatric): Secondary | ICD-10-CM | POA: Diagnosis not present

## 2021-08-21 DIAGNOSIS — Z72821 Inadequate sleep hygiene: Secondary | ICD-10-CM

## 2021-08-21 DIAGNOSIS — Z23 Encounter for immunization: Secondary | ICD-10-CM | POA: Diagnosis not present

## 2021-08-21 NOTE — Patient Instructions (Signed)
Will arrange for new CPAP set up and try to arrange for a new medical equipment supply company  Follow up in 4 to 5 months

## 2021-08-21 NOTE — Progress Notes (Signed)
[facility]  Chief Complaint  Patient presents with   Consult    Sleep Consult. Sleep apnea hasnt worn cpap machine in over 3 months because of the mask. HST [place] 3 months ago.     Past Surgical History:  She  has a past surgical history that includes Cesarean section; skin tag removed; Total abdominal hysterectomy (2003); Bilateral tubal ligation; Colonoscopy (11/20/2011); Left knee  melanoma excsion; spleen removed; Splenectomy; Distal interphalangeal joint fusion (Left, 06/20/2014); Carpal tunnel release (Left, 06/20/2014); Lymph node biopsy (1992); Carpal tunnel release (Right, 11/11/2016); Distal interphalangeal joint fusion (Right, 11/11/2016); and Cholecystectomy (N/A, 10/26/2017).  Past Medical History:  Anxiety, OA, Sarcoidosis, DVT Rt leg, HLD, GERD, HTN, Hypothyroidism, Migraine headaches, RLS, Rheumatoid arthritis  Constitutional:  BP 128/72 (BP Location: Left Arm, Patient Position: Sitting)   Pulse 82   Temp 98.4 F (36.9 C) (Temporal)   Ht 5' 4.5" (1.638 m)   Wt 219 lb 1.3 oz (99.4 kg)   SpO2 97%   BMI 37.02 kg/m   Brief Summary:  Samantha Eaton is a 60 y.o. female with obstructive sleep apnea.      Subjective:   She had a sleep study 20 or 30 years ago and was told she has sleep apnea.  She has been using CPAP intermittently since.  Her last CPAP machine was from about 8 years ago and she had been using [facility] for her DME.  She was still snoring and having trouble with mask fit.  She hasn't been able to use CPAP for the past several months.    She previously worked as Clinical biochemist for emergency services.  She recently retired.  After retirement she hasn't been able to keep a regular schedule.  She takes care of her grandchildren a couple days a week.  She will fall asleep after they get picked up.  Once she wakes up then she can't fall back to sleep for another several hours.  As a result she feels tired the next  day.    She snores w/o using CPAP.  She usually goes to bed between midnight and 3 am.  She falls asleep quickly when she does finally go to bed.  She wakes up 2 or 3 times to use the bathroom.  She tries to get out of bed at 7 am.  She feels tired throughout the day.  She isn't using anything to help sleep.  She drinks coffee in the morning.  Her Epworth score is 18 out of 24.  Physical Exam:   Appearance - well kempt   ENMT - no sinus tenderness, no oral exudate, no LAN, Mallampati 3 airway, no stridor, wears dentures, elongated uvula, 2+ tonsils  Respiratory - equal breath sounds bilaterally, no wheezing or rales  CV - s1s2 regular rate and rhythm, no murmurs  Ext - no clubbing, no edema  Skin - no rashes  Psych - normal mood and affect   Sleep Tests:  PSG 06/14/21 >> AHI 24.1, SpO2 85%; CPAP 10 cm H2O  Social History:  She  reports that she has never smoked. She has never used smokeless tobacco. She reports current alcohol use. She reports that she does not use drugs.  Family History:  Her family history includes Asthma in her son; Cancer in her sister; Diabetes in her maternal grandmother, mother, paternal grandmother, sister, sister, and sister; Heart attack in her sister; Heart attack (age of onset: 24) in her father and mother;  Liver disease in her maternal grandmother; Other in her brother and son.    Discussion:  She has snoring, sleep disruption, apnea, and daytime sleepiness.  She has history of hypertension and anxiety.  Her BMI is > 35.  Her sleep study shows moderate obstructive sleep apnea.  Assessment/Plan:   Snoring with excessive daytime sleepiness from obstructive sleep apnea. - reviewed her sleep study results with her - she is agreeable to try CPAP therapy again - will arrange for auto CPAP 8 to 12 cm H2O  - she previously used [facility] for her DME, but would like to switch to a different DME  Poor sleep hygiene. - discussed importance of maintaining a  regular sleep wake schedule - advised her to try consolidating her sleep, and limiting nap sessions to less than 20 minutes if she is unable to avoid naps all together - proper sleep hygiene techniques reviewed  Obesity. - discussed how weight can impact sleep and risk for sleep disordered breathing - discussed options to assist with weight loss: combination of diet modification, cardiovascular and strength training exercises  Rheumatoid arthritis. - she is followed by Dr. Berna Bue with Rheumatology  Cardiovascular risk. - had an extensive discussion regarding the adverse health consequences related to untreated sleep disordered breathing - specifically discussed the risks for hypertension, coronary artery disease, cardiac dysrhythmias, cerebrovascular disease, and diabetes - lifestyle modification discussed  Safe driving practices. - discussed how sleep disruption can increase risk of accidents, particularly when driving - safe driving practices were discussed  Therapies for obstructive sleep apnea. - if the sleep study shows significant sleep apnea, then various therapies for treatment were reviewed: CPAP, oral appliance, and surgical interventions - discussed option of an Inspire device; her BMI would likely need to be less than 32 before should could even consider further assessment for this   Time Spent Involved in Patient Care on Day of Examination:  36 minutes  Follow up:   Patient Instructions  Will arrange for new CPAP set up and try to arrange for a new medical equipment supply company  Follow up in 4 to 5 months  Medication List:   Allergies as of 08/21/2021   No Known Allergies      Medication List        Accurate as of August 21, 2021 12:25 PM. If you have any questions, ask your nurse or doctor.          acetaminophen 500 MG tablet Commonly known as: TYLENOL Take 1,000 mg by mouth every 8 (eight) hours as needed.   amLODipine 5 MG  tablet Commonly known as: NORVASC Take 1 tablet (5 mg total) by mouth daily.   cetirizine 10 MG tablet Commonly known as: ZYRTEC Take 10 mg by mouth daily.   diclofenac 75 MG EC tablet Commonly known as: VOLTAREN Take by mouth.   escitalopram 10 MG tablet Commonly known as: LEXAPRO Take 1 tablet (10 mg total) by mouth daily.   hydrocortisone-pramoxine 2.5-1 % rectal cream Commonly known as: ANALPRAM-HC Place 1 application rectally 3 (three) times daily.   hydroxychloroquine 200 MG tablet Commonly known as: PLAQUENIL Take 200 mg by mouth 2 (two) times daily.   levothyroxine 50 MCG tablet Commonly known as: SYNTHROID TAKE ONE (1) TABLET EACH DAY   pramipexole 0.5 MG tablet Commonly known as: MIRAPEX TAKE 1 TABLET BY MOUTH TWICE DAILY   simvastatin 20 MG tablet Commonly known as: ZOCOR Take 1 tablet (20 mg total) by mouth daily.  valACYclovir 1000 MG tablet Commonly known as: Valtrex Take 1 tablet (1,000 mg total) by mouth daily as needed (fever blister). Take 2 then 2 next day for fever blisters prn        Signature:  Chesley Mires, MD [place] Pager - [phone] 08/21/2021, 12:25 PM

## 2021-09-05 ENCOUNTER — Telehealth: Payer: Self-pay | Admitting: Pulmonary Disease

## 2021-09-05 DIAGNOSIS — G4733 Obstructive sleep apnea (adult) (pediatric): Secondary | ICD-10-CM

## 2021-09-05 NOTE — Telephone Encounter (Signed)
VS please advise. Thanks  I have called the pt and she stated that she spoke with [facility] and they told her that she could probably use her machine that she has now but would have to update her settings.  Pt would also like an order for a new mask and supplies to be sent to [facility].  VS are you ok with this.  Thanks

## 2021-09-05 NOTE — Telephone Encounter (Signed)
Order has been placed for [employer].  Nothing further is needed.

## 2021-09-05 NOTE — Telephone Encounter (Signed)
Please have her auto CPAP set to 8 to 12 cm H2O  Okay to send order for new CPAP supplies to [facility]

## 2021-11-05 ENCOUNTER — Other Ambulatory Visit: Payer: Self-pay | Admitting: Adult Health

## 2021-12-02 ENCOUNTER — Encounter: Payer: Self-pay | Admitting: *Deleted

## 2021-12-04 DIAGNOSIS — M545 Low back pain, unspecified: Secondary | ICD-10-CM | POA: Diagnosis not present

## 2022-01-30 DIAGNOSIS — H2513 Age-related nuclear cataract, bilateral: Secondary | ICD-10-CM | POA: Diagnosis not present

## 2022-01-30 DIAGNOSIS — H524 Presbyopia: Secondary | ICD-10-CM | POA: Diagnosis not present

## 2022-01-30 DIAGNOSIS — H52203 Unspecified astigmatism, bilateral: Secondary | ICD-10-CM | POA: Diagnosis not present

## 2022-01-30 DIAGNOSIS — H40013 Open angle with borderline findings, low risk, bilateral: Secondary | ICD-10-CM | POA: Diagnosis not present

## 2022-01-30 DIAGNOSIS — M069 Rheumatoid arthritis, unspecified: Secondary | ICD-10-CM | POA: Diagnosis not present

## 2022-01-30 DIAGNOSIS — Z79899 Other long term (current) drug therapy: Secondary | ICD-10-CM | POA: Diagnosis not present

## 2022-01-30 DIAGNOSIS — H5213 Myopia, bilateral: Secondary | ICD-10-CM | POA: Diagnosis not present

## 2022-02-03 ENCOUNTER — Other Ambulatory Visit: Payer: Self-pay | Admitting: Internal Medicine

## 2022-02-03 DIAGNOSIS — Z1231 Encounter for screening mammogram for malignant neoplasm of breast: Secondary | ICD-10-CM

## 2022-03-24 DIAGNOSIS — M154 Erosive (osteo)arthritis: Secondary | ICD-10-CM | POA: Diagnosis not present

## 2022-03-24 DIAGNOSIS — Z1589 Genetic susceptibility to other disease: Secondary | ICD-10-CM | POA: Diagnosis not present

## 2022-03-24 DIAGNOSIS — M459 Ankylosing spondylitis of unspecified sites in spine: Secondary | ICD-10-CM | POA: Diagnosis not present

## 2022-03-24 DIAGNOSIS — D869 Sarcoidosis, unspecified: Secondary | ICD-10-CM | POA: Diagnosis not present

## 2022-04-21 DIAGNOSIS — M150 Primary generalized (osteo)arthritis: Secondary | ICD-10-CM | POA: Diagnosis not present

## 2022-05-22 ENCOUNTER — Ambulatory Visit
Admission: RE | Admit: 2022-05-22 | Discharge: 2022-05-22 | Disposition: A | Discharge status: Home / Self Care | Payer: BC Managed Care – PPO | Source: Ambulatory Visit | Attending: Internal Medicine | Admitting: Internal Medicine

## 2022-05-22 DIAGNOSIS — Z1231 Encounter for screening mammogram for malignant neoplasm of breast: Secondary | ICD-10-CM | POA: Diagnosis not present

## 2022-05-27 ENCOUNTER — Ambulatory Visit: Payer: PRIVATE HEALTH INSURANCE | Admitting: Gastroenterology

## 2022-06-03 ENCOUNTER — Other Ambulatory Visit: Payer: Self-pay | Admitting: Adult Health

## 2022-06-04 ENCOUNTER — Other Ambulatory Visit: Payer: Self-pay | Admitting: Adult Health

## 2022-06-11 ENCOUNTER — Encounter: Payer: Self-pay | Admitting: Gastroenterology

## 2022-06-11 ENCOUNTER — Ambulatory Visit (INDEPENDENT_AMBULATORY_CARE_PROVIDER_SITE_OTHER): Payer: BC Managed Care – PPO | Admitting: Gastroenterology

## 2022-06-11 ENCOUNTER — Encounter: Payer: Self-pay | Admitting: *Deleted

## 2022-06-11 DIAGNOSIS — Z1211 Encounter for screening for malignant neoplasm of colon: Secondary | ICD-10-CM | POA: Diagnosis not present

## 2022-06-11 DIAGNOSIS — K642 Third degree hemorrhoids: Secondary | ICD-10-CM | POA: Insufficient documentation

## 2022-06-11 MED ORDER — KETOCONAZOLE 2 % EX CREA: 1.0000 | TOPICAL_CREAM | Freq: Two times a day (BID) | CUTANEOUS | 0 refills | Status: DC

## 2022-06-11 MED ORDER — CLENPIQ 10-3.5-12 MG-GM -GM/175ML PO SOLN: 1.0000 | ORAL | 0 refills | Status: DC

## 2022-06-11 NOTE — Patient Instructions (Addendum)
I will see you back in a few weeks for banding.  We are arranging a colonoscopy with Dr. Gala Romney in the near future!  I have sent in a cream to use around rectum to help with any yeast that may be there causing issues.   It was a pleasure to see you today. I want to create trusting relationships with patients to provide genuine, compassionate, and quality care. I value your feedback. If you receive a survey regarding your visit,  I greatly appreciate you taking time to fill this out.   Annitta Needs, PhD, ANP-BC [facility]

## 2022-06-11 NOTE — Progress Notes (Signed)
Gastroenterology Office Note    Referring Provider: Asencion Noble, MD Primary Care Physician:  Asencion Noble, MD  Primary GI: Dr. Gala Romney     Chief Complaint   Chief Complaint  Patient presents with   Hemorrhoids    Referred for colonoscopy and hemorrhoids. Having some pain and itching from hemorrhoids. Patient last colonoscopy was with Dr. Sydell Axon in 2013.      History of Present Illness   Samantha Eaton is a 61 y.o. female presenting today at the request of Asencion Noble, MD due to need for colonoscopy and concern for hemorrhoids. Last colonoscopy in 2013 with hemorrhoids, otherwise normal. No FH colon cancer or polyps.   She notes tissue prolapsing, burning, painful at times. Itching. Creams not helpful. Inermittent bleeding. Stools soft, 3-4 times per day. No constipation. Colace caused too frequent stools. No prolonged toilet time or straining. Hemorrhoids since first pregnancy.      Past Medical History:  Diagnosis Date   Anxiety    Arthritis    back, neck, hands   Constipation    Depression    DVT (deep venous thrombosis) (HCC)    right leg, years ago   Elevated cholesterol 09/19/2013   GERD (gastroesophageal reflux disease)    Hemorrhoids    HTN (hypertension)    not on medication   Hyperlipemia    Hypothyroidism    Migraines    OA (osteoarthritis)    Obstructive sleep apnea    Overweight(278.02)    PONV (postoperative nausea and vomiting)    Rheumatoid arthritis (HCC)    RLS (restless legs syndrome)    Sarcoidosis    in remission for over ten years    Past Surgical History:  Procedure Laterality Date   Bilateral tubal ligation     CARPAL TUNNEL RELEASE Left 06/20/2014   Procedure: LEFT CARPAL TUNNEL RELEASE;  Surgeon: Daryll Brod, MD;  Location: [place];  Service: Orthopedics;  Laterality: Left;   CARPAL TUNNEL RELEASE Right 11/11/2016   Procedure: CARPAL TUNNEL RELEASE, right;  Surgeon: Daryll Brod, MD;  Location: [place];  Service: Orthopedics;  Laterality: Right;   CESAREAN SECTION     x 2   CHOLECYSTECTOMY N/A 10/26/2017   Procedure: LAPAROSCOPIC CHOLECYSTECTOMY;  Surgeon: Aviva Signs, MD;  Location: [facility];  Service: General;  Laterality: N/A;   COLONOSCOPY  11/20/2011   DR. Rourk: Hemorrhoids   DISTAL INTERPHALANGEAL JOINT FUSION Left 06/20/2014   Procedure: DISTAL INTERPHALANGEAL JOINT FUSION LEFT INDEX, MIDDLE FINGERS;  Surgeon: Daryll Brod, MD;  Location: [place];  Service: Orthopedics;  Laterality: Left;   DISTAL INTERPHALANGEAL JOINT FUSION Right 11/11/2016   Procedure: DISTAL INTERPHALANGEAL JOINT FUSION right index and ring;  Surgeon: Daryll Brod, MD;  Location: [place];  Service: Orthopedics;  Laterality: Right;   Left knee  melanoma excsion     LYMPH NODE BIOPSY  1992   removed from chest area via ant neck excision for biopsy- sarcoidosis dx   skin tag removed     perianal, Dr. Glo Herring   spleen removed     secondary to [place]  2003   Left ovary removed    Current Outpatient Medications  Medication Sig Dispense Refill   amLODipine (NORVASC) 5 MG tablet Take 1 tablet (5 mg total) by mouth daily. 90 tablet 4   cetirizine (ZYRTEC) 10 MG tablet Take 10 mg by mouth  daily.     diclofenac (VOLTAREN) 75 MG EC tablet Take by mouth 2 (two) times daily.     escitalopram (LEXAPRO) 10 MG tablet Take 1 tablet (10 mg total) by mouth daily. 90 tablet 4   hydroxychloroquine (PLAQUENIL) 200 MG tablet Take 200 mg by mouth 2 (two) times daily.     ketoconazole (NIZORAL) 2 % cream Apply 1 Application topically 2 (two) times daily. To rectum 30 g 0   levothyroxine (SYNTHROID) 50 MCG tablet TAKE 1 TABLET BY MOUTH EACH DAY 90 tablet 3   pramipexole (MIRAPEX) 0.5 MG tablet TAKE 1 TABLET BY MOUTH  TWICE DAILY 180 tablet 3   simvastatin (ZOCOR) 20 MG tablet Take 1 tablet (20 mg total) by mouth daily. 90  tablet 4   valACYclovir (VALTREX) 1000 MG tablet TAKE 2 TABLETS BY MOUTH THE DAY OF FEVER BLISTERS AND  THEN 2 TABLETS THE NEXT DAY 30 tablet 3   Sod Picosulfate-Mag Ox-Cit Acd (CLENPIQ) 10-3.5-12 MG-GM -GM/175ML SOLN Take 1 kit by mouth as directed. 350 mL 0   No current facility-administered medications for this visit.    Allergies as of 06/11/2022   (No Known Allergies)    Family History  Problem Relation Age of Onset   Heart attack Mother 51       deceased   Diabetes Mother    Heart attack Father 85       deceased   Breast cancer Sister 36   Diabetes Sister    Cancer Sister    Diabetes Sister    Diabetes Sister    Heart attack Sister    Other Brother        Black lung   Liver disease Maternal Grandmother        ???   Diabetes Maternal Grandmother    Diabetes Paternal 72    Asthma Son    Other Son        killed in MVA   Colon cancer Neg Hx    Ovarian cancer Neg Hx    Colon polyps Neg Hx        unknown    Social History   Socioeconomic History   Marital status: Married    Spouse name: Not on file   Number of children: 2   Years of education: Not on file   Highest education level: Not on file  Occupational History   Occupation: Lobbyist for the state of [place]    Employer: [employer]  Tobacco Use   Smoking status: Never    Passive exposure: Past   Smokeless tobacco: Never  Vaping Use   Vaping Use: Never used  Substance and Sexual Activity   Alcohol use: Yes    Alcohol/week: 0.0 standard drinks of alcohol    Comment: rarely- once or twice a year   Drug use: No   Sexual activity: Not Currently    Birth control/protection: Surgical    Comment: hyst  Other Topics Concern   Not on file  Social History Narrative   Son, deceased age 6, 6.   Social Determinants of Health   Financial Resource Strain: Low Risk  (06/13/2021)   Overall Financial Resource Strain (CARDIA)    Difficulty of Paying Living  Expenses: Not hard at all  Food Insecurity: No Food Insecurity (06/13/2021)   Hunger Vital Sign    Worried About Running Out of Food in the Last Year: Never true    Ran Out of Food in the Last Year:  Never true  Transportation Needs: No Transportation Needs (06/13/2021)   PRAPARE - Hydrologist (Medical): No    Lack of Transportation (Non-Medical): No  Physical Activity: Inactive (06/13/2021)   Exercise Vital Sign    Days of Exercise per Week: 0 days    Minutes of Exercise per Session: 0 min  Stress: Stress Concern Present (06/13/2021)   [place]    Feeling of Stress : To some extent  Social Connections: Socially Integrated (06/13/2021)   Social Connection and Isolation Panel [NHANES]    Frequency of Communication with Friends and Family: Three times a week    Frequency of Social Gatherings with Friends and Family: Once a week    Attends Religious Services: 1 to 4 times per year    Active Member of [employer] or Organizations: Yes    Attends Archivist Meetings: 1 to 4 times per year    Marital Status: Married  Human resources officer Violence: Not At Risk (06/13/2021)   Humiliation, Afraid, Rape, and Kick questionnaire    Fear of Current or Ex-Partner: No    Emotionally Abused: No    Physically Abused: No    Sexually Abused: No     Review of Systems   Gen: Denies any fever, chills, fatigue, weight loss, lack of appetite.  CV: Denies chest pain, heart palpitations, peripheral edema, syncope.  Resp: Denies shortness of breath at rest or with exertion. Denies wheezing or cough.  GI: see HPI GU : Denies urinary burning, urinary frequency, urinary hesitancy MS: Denies joint pain, muscle weakness, cramps, or limitation of movement.  Derm: Denies rash, itching, dry skin Psych: Denies depression, anxiety, memory loss, and confusion Heme: Denies bruising, bleeding, and enlarged lymph  nodes.   Physical Exam   BP (!) 142/88 (BP Location: Left Arm, Patient Position: Sitting, Cuff Size: Large)   Pulse 76   Temp 98.1 F (36.7 C) (Oral)   Ht 5' 4.5" (1.638 m)   Wt 220 lb 4.8 oz (99.9 kg)   BMI 37.23 kg/m  General:   Alert and oriented. Pleasant and cooperative. Well-nourished and well-developed.  Head:  Normocephalic and atraumatic. Eyes:  Without icterus Ears:  Normal auditory acuity. Lungs:  Clear to auscultation bilaterally.  Heart:  S1, S2 present without murmurs appreciated.  Abdomen:  +BS, soft, non-tender and non-distended. No HSM noted. No guarding or rebound. No masses appreciated.  Rectal:  yeast between buttocks. Anoscopy with Grade 3 all columns. Right anterior skin tag.  Msk:  Symmetrical without gross deformities. Normal posture. Extremities:  Without edema. Neurologic:  Alert and  oriented x4;  grossly normal neurologically. Skin:  Intact without significant lesions or rashes. Psych:  Alert and cooperative. Normal mood and affect.   Assessment   Samantha Eaton is a 61 y.o. female presenting today with need for screening colonoscopy and was brought in due to chronic symptomatic hemorrhoids.   She has had chronic prolapse, itching, burning, low-volume hematochezia. Grade 3 internal hemorrhoids. No FH colon cancer or polyps. Known hemorrhoids on last colonoscopy 2013. May be a good candidate for banding. Will pursue colonoscopy in near future and see her thereafter.     PLAN   Proceed with colonoscopy by Dr. Gala Romney in near future: the risks, benefits, and alternatives have been discussed with the patient in detail. The patient states understanding and desires to proceed.  Ketoconazole between buttocks for yeast  Return in follow-up for banding  Annitta Needs, PhD, ANP-BC [facility]

## 2022-06-12 ENCOUNTER — Encounter: Payer: Self-pay | Admitting: *Deleted

## 2022-06-26 ENCOUNTER — Encounter: Payer: Self-pay | Admitting: Gastroenterology

## 2022-06-26 ENCOUNTER — Encounter: Payer: PRIVATE HEALTH INSURANCE | Admitting: Gastroenterology

## 2022-07-03 ENCOUNTER — Ambulatory Visit (INDEPENDENT_AMBULATORY_CARE_PROVIDER_SITE_OTHER): Payer: BC Managed Care – PPO | Admitting: Gastroenterology

## 2022-07-03 ENCOUNTER — Encounter: Payer: Self-pay | Admitting: Gastroenterology

## 2022-07-03 VITALS — BP 149/85 | HR 74 | Temp 98.0°F | Ht 65.0 in | Wt 221.8 lb

## 2022-07-03 DIAGNOSIS — K642 Third degree hemorrhoids: Secondary | ICD-10-CM | POA: Diagnosis not present

## 2022-07-03 NOTE — Patient Instructions (Signed)
Please continue to avoid straining.  You should limit your toilet time to 2-3 minutes at the most.   Continue to avoid constipation.  Please call me with any concerns or issues!  I will see you in follow-up for additional banding in several weeks.    I enjoyed seeing you again today! As you know, I value our relationship and want to provide genuine, compassionate, and quality care. I welcome your feedback. If you receive a survey regarding your visit,  I greatly appreciate you taking time to fill this out. See you next time!  Yesha Muchow W. Shakeira Rhee, PhD, ANP-BC [facility]       

## 2022-07-03 NOTE — Progress Notes (Signed)
       [place] BANDING PROCEDURE NOTE  Samantha Eaton is a 61 y.o. female presenting today for consideration of hemorrhoid banding. Last colonoscopy  in 2013 with hemorrhoids, otherwise normal. No FH colon cancer or polyps. Anoscopy completed at last visit with Grade 3 all columns. Upcoming colonoscopy 8/30. She notes intermittent bleeding and prolapse. She had fungal-type rash between buttocks when last seen. This has improved with prescribed cream from last visit.    The patient presents with symptomatic grade 3 hemorrhoids, unresponsive to maximal medical therapy, requesting rubber band ligation of her hemorrhoidal disease. All risks, benefits, and alternative forms of therapy were described and informed consent was obtained.  The decision was made to band the left lateral internal hemorrhoid, and the [place] was used to perform band ligation without complication. Digital anorectal examination was then performed to assure proper positioning of the band, and to adjust the banded tissue as required. The patient was discharged home without pain or other issues. Dietary and behavioral recommendations were given, along with follow-up instructions. The patient will return in several weeks for followup and possible additional banding as required. She has colonoscopy upcoming in the interim.   No complications were encountered and the patient tolerated the procedure well.   Annitta Needs, PhD, ANP-BC [facility]

## 2022-07-10 NOTE — Patient Instructions (Signed)
Samantha Eaton  07/10/2022     '@PREFPERIOPPHARMACY'$ @   Your procedure is scheduled on  07/16/2022.   Report to [facility] at  [place].M.   Call this number if you have problems the morning of surgery:  [phone]   Remember:  Follow the diet and prep instructions given to you by the office.     Take these medicines the morning of surgery with A SIP OF WATER                       Norvasc, zyrtec, lexapro, synthroid, prilosec.     Do not wear jewelry, make-up or nail polish.  Do not wear lotions, powders, or perfumes, or deodorant.  Do not shave 48 hours prior to surgery.  Men may shave face and neck.  Do not bring valuables to the hospital.  [facility] is not responsible for any belongings or valuables.  Contacts, dentures or bridgework may not be worn into surgery.  Leave your suitcase in the car.  After surgery it may be brought to your room.  For patients admitted to the hospital, discharge time will be determined by your treatment team.  Patients discharged the day of surgery will not be allowed to drive home and must have someone with them for 24 hours.    Special instructions:   DO NOT smoke tobacco or vape for 24 hours before your procedure.  Please read over the following fact sheets that you were given. Anesthesia Post-op Instructions and Care and Recovery After Surgery      Colonoscopy, Adult, Care After The following information offers guidance on how to care for yourself after your procedure. Your health care provider may also give you more specific instructions. If you have problems or questions, contact your health care provider. What can I expect after the procedure? After the procedure, it is common to have: A small amount of blood in your stool for 24 hours after the procedure. Some gas. Mild cramping or bloating of your abdomen. Follow these instructions at home: Eating and drinking  Drink enough fluid to keep your urine pale  yellow. Follow instructions from your health care provider about eating or drinking restrictions. Resume your normal diet as told by your health care provider. Avoid heavy or fried foods that are hard to digest. Activity Rest as told by your health care provider. Avoid sitting for a long time without moving. Get up to take short walks every 1-2 hours. This is important to improve blood flow and breathing. Ask for help if you feel weak or unsteady. Return to your normal activities as told by your health care provider. Ask your health care provider what activities are safe for you. Managing cramping and bloating  Try walking around when you have cramps or feel bloated. If directed, apply heat to your abdomen as told by your health care provider. Use the heat source that your health care provider recommends, such as a moist heat pack or a heating pad. Place a towel between your skin and the heat source. Leave the heat on for 20-30 minutes. Remove the heat if your skin turns bright red. This is especially important if you are unable to feel pain, heat, or cold. You have a greater risk of getting burned. General instructions If you were given a sedative during the procedure, it can affect you for several hours. Do not drive or operate machinery until your  health care provider says that it is safe. For the first 24 hours after the procedure: Do not sign important documents. Do not drink alcohol. Do your regular daily activities at a slower pace than normal. Eat soft foods that are easy to digest. Take over-the-counter and prescription medicines only as told by your health care provider. Keep all follow-up visits. This is important. Contact a health care provider if: You have blood in your stool 2-3 days after the procedure. Get help right away if: You have more than a small spotting of blood in your stool. You have large blood clots in your stool. You have swelling of your abdomen. You have  nausea or vomiting. You have a fever. You have increasing pain in your abdomen that is not relieved with medicine. These symptoms may be an emergency. Get help right away. Call 911. Do not wait to see if the symptoms will go away. Do not drive yourself to the hospital. Summary After the procedure, it is common to have a small amount of blood in your stool. You may also have mild cramping and bloating of your abdomen. If you were given a sedative during the procedure, it can affect you for several hours. Do not drive or operate machinery until your health care provider says that it is safe. Get help right away if you have a lot of blood in your stool, nausea or vomiting, a fever, or increased pain in your abdomen. This information is not intended to replace advice given to you by your health care provider. Make sure you discuss any questions you have with your health care provider. Document Revised: 06/26/2021 Document Reviewed: 06/26/2021 Elsevier Patient Education  [place] After This sheet gives you information about how to care for yourself after your procedure. Your health care provider may also give you more specific instructions. If you have problems or questions, contact your health care provider. What can I expect after the procedure? After the procedure, it is common to have: Tiredness. Forgetfulness about what happened after the procedure. Impaired judgment for important decisions. Nausea or vomiting. Some difficulty with balance. Follow these instructions at home: For the time period you were told by your health care provider:     Rest as needed. Do not participate in activities where you could fall or become injured. Do not drive or use machinery. Do not drink alcohol. Do not take sleeping pills or medicines that cause drowsiness. Do not make important decisions or sign legal documents. Do not take care of children on your  own. Eating and drinking Follow the diet that is recommended by your health care provider. Drink enough fluid to keep your urine pale yellow. If you vomit: Drink water, juice, or soup when you can drink without vomiting. Make sure you have little or no nausea before eating solid foods. General instructions Have a responsible adult stay with you for the time you are told. It is important to have someone help care for you until you are awake and alert. Take over-the-counter and prescription medicines only as told by your health care provider. If you have sleep apnea, surgery and certain medicines can increase your risk for breathing problems. Follow instructions from your health care provider about wearing your sleep device: Anytime you are sleeping, including during daytime naps. While taking prescription pain medicines, sleeping medicines, or medicines that make you drowsy. Avoid smoking. Keep all follow-up visits as told by your health care provider. This is  important. Contact a health care provider if: You keep feeling nauseous or you keep vomiting. You feel light-headed. You are still sleepy or having trouble with balance after 24 hours. You develop a rash. You have a fever. You have redness or swelling around the IV site. Get help right away if: You have trouble breathing. You have new-onset confusion at home. Summary For several hours after your procedure, you may feel tired. You may also be forgetful and have poor judgment. Have a responsible adult stay with you for the time you are told. It is important to have someone help care for you until you are awake and alert. Rest as told. Do not drive or operate machinery. Do not drink alcohol or take sleeping pills. Get help right away if you have trouble breathing, or if you suddenly become confused. This information is not intended to replace advice given to you by your health care provider. Make sure you discuss any questions you  have with your health care provider. Document Revised: 10/08/2021 Document Reviewed: 10/06/2019 Elsevier Patient Education  [place].

## 2022-07-14 ENCOUNTER — Encounter (HOSPITAL_COMMUNITY): Payer: Self-pay

## 2022-07-14 ENCOUNTER — Encounter (HOSPITAL_COMMUNITY)
Admission: RE | Admit: 2022-07-14 | Discharge: 2022-07-14 | Disposition: A | Discharge status: Home / Self Care | Payer: BC Managed Care – PPO | Source: Ambulatory Visit | Attending: Internal Medicine | Admitting: Internal Medicine

## 2022-07-14 VITALS — BP 146/82 | HR 78 | Temp 98.0°F | Resp 18 | Ht 65.0 in | Wt 221.8 lb

## 2022-07-14 DIAGNOSIS — I10 Essential (primary) hypertension: Secondary | ICD-10-CM | POA: Insufficient documentation

## 2022-07-14 DIAGNOSIS — E039 Hypothyroidism, unspecified: Secondary | ICD-10-CM | POA: Diagnosis not present

## 2022-07-14 DIAGNOSIS — D123 Benign neoplasm of transverse colon: Secondary | ICD-10-CM | POA: Diagnosis not present

## 2022-07-14 DIAGNOSIS — Z1211 Encounter for screening for malignant neoplasm of colon: Secondary | ICD-10-CM | POA: Diagnosis not present

## 2022-07-14 DIAGNOSIS — Z01810 Encounter for preprocedural cardiovascular examination: Secondary | ICD-10-CM | POA: Insufficient documentation

## 2022-07-14 DIAGNOSIS — G4730 Sleep apnea, unspecified: Secondary | ICD-10-CM | POA: Diagnosis not present

## 2022-07-14 DIAGNOSIS — F418 Other specified anxiety disorders: Secondary | ICD-10-CM | POA: Diagnosis not present

## 2022-07-14 DIAGNOSIS — K621 Rectal polyp: Secondary | ICD-10-CM | POA: Diagnosis not present

## 2022-07-14 DIAGNOSIS — K219 Gastro-esophageal reflux disease without esophagitis: Secondary | ICD-10-CM | POA: Diagnosis not present

## 2022-07-16 ENCOUNTER — Ambulatory Visit (HOSPITAL_COMMUNITY): Payer: BC Managed Care – PPO | Admitting: Anesthesiology

## 2022-07-16 ENCOUNTER — Other Ambulatory Visit: Payer: Self-pay | Admitting: Adult Health

## 2022-07-16 ENCOUNTER — Encounter (HOSPITAL_COMMUNITY): Payer: Self-pay | Admitting: Internal Medicine

## 2022-07-16 ENCOUNTER — Ambulatory Visit (HOSPITAL_COMMUNITY)
Admission: RE | Admit: 2022-07-16 | Discharge: 2022-07-16 | Disposition: A | Discharge status: Home / Self Care | Payer: BC Managed Care – PPO | Attending: Internal Medicine | Admitting: Internal Medicine

## 2022-07-16 ENCOUNTER — Encounter (HOSPITAL_COMMUNITY)
Admission: RE | Disposition: A | Discharge status: Home / Self Care | Payer: Self-pay | Source: Home / Self Care | Attending: Internal Medicine

## 2022-07-16 DIAGNOSIS — Z1212 Encounter for screening for malignant neoplasm of rectum: Secondary | ICD-10-CM | POA: Diagnosis not present

## 2022-07-16 DIAGNOSIS — K6289 Other specified diseases of anus and rectum: Secondary | ICD-10-CM | POA: Diagnosis not present

## 2022-07-16 DIAGNOSIS — K635 Polyp of colon: Secondary | ICD-10-CM | POA: Diagnosis not present

## 2022-07-16 DIAGNOSIS — E039 Hypothyroidism, unspecified: Secondary | ICD-10-CM | POA: Insufficient documentation

## 2022-07-16 DIAGNOSIS — I10 Essential (primary) hypertension: Secondary | ICD-10-CM | POA: Diagnosis not present

## 2022-07-16 DIAGNOSIS — F418 Other specified anxiety disorders: Secondary | ICD-10-CM | POA: Insufficient documentation

## 2022-07-16 DIAGNOSIS — D123 Benign neoplasm of transverse colon: Secondary | ICD-10-CM | POA: Insufficient documentation

## 2022-07-16 DIAGNOSIS — G4730 Sleep apnea, unspecified: Secondary | ICD-10-CM | POA: Diagnosis not present

## 2022-07-16 DIAGNOSIS — Z1211 Encounter for screening for malignant neoplasm of colon: Secondary | ICD-10-CM | POA: Diagnosis not present

## 2022-07-16 DIAGNOSIS — K219 Gastro-esophageal reflux disease without esophagitis: Secondary | ICD-10-CM | POA: Insufficient documentation

## 2022-07-16 DIAGNOSIS — K621 Rectal polyp: Secondary | ICD-10-CM | POA: Insufficient documentation

## 2022-07-16 HISTORY — PX: COLONOSCOPY WITH PROPOFOL: SHX5780

## 2022-07-16 HISTORY — PX: POLYPECTOMY: SHX5525

## 2022-07-16 SURGERY — COLONOSCOPY WITH PROPOFOL
Anesthesia: General

## 2022-07-16 MED ORDER — PROPOFOL 500 MG/50ML IV EMUL
INTRAVENOUS | Status: DC | PRN
  Administered 2022-07-16: 150 ug/kg/min via INTRAVENOUS

## 2022-07-16 MED ORDER — LACTATED RINGERS IV SOLN: INTRAVENOUS | Status: DC | PRN

## 2022-07-16 MED ORDER — PROPOFOL 500 MG/50ML IV EMUL
INTRAVENOUS | Status: AC
  Filled 2022-07-16: qty 50

## 2022-07-16 MED ORDER — PROPOFOL 10 MG/ML IV BOLUS
INTRAVENOUS | Status: DC | PRN
  Administered 2022-07-16: 60 mg via INTRAVENOUS

## 2022-07-16 NOTE — Transfer of Care (Signed)
Immediate Anesthesia Transfer of Care Note  Patient: Samantha Eaton  Procedure(s) Performed: COLONOSCOPY WITH PROPOFOL POLYPECTOMY  Patient Location: PACU  Anesthesia Type:General  Level of Consciousness: awake, alert  and oriented  Airway & Oxygen Therapy: Patient Spontanous Breathing  Post-op Assessment: Report given to RN, Post -op Vital signs reviewed and stable, Patient moving all extremities X 4 and Patient able to stick tongue midline  Post vital signs: Reviewed  Last Vitals:  Vitals Value Taken Time  BP 117/63   Temp 97.6   Pulse 67   Resp 20   SpO2 93     Last Pain:  Vitals:   07/16/22 0951  TempSrc:   PainSc: 0-No pain      Patients Stated Pain Goal: 7 (72/15/87 2761)  Complications: No notable events documented.

## 2022-07-16 NOTE — H&P (Signed)
'@LOGO' @   Primary Care Physician:  Asencion Noble, MD Primary Gastroenterologist:  Dr. Gala Romney  Pre-Procedure History & Physical: HPI:  Samantha Eaton is a 61 y.o. female here to update colorectal cancer screening via colonoscopy.  Last colonoscopy 2013.  Her only GI symptom at this time is symptomatic hemorrhoids. (Pruritus, pain intermittent paper hematochezia)   Past Medical History:  Diagnosis Date   Anxiety    Arthritis    back, neck, hands   Constipation    Depression    DVT (deep venous thrombosis) (HCC)    right leg, years ago   Elevated cholesterol 09/19/2013   GERD (gastroesophageal reflux disease)    Hemorrhoids    HTN (hypertension)    not on medication   Hyperlipemia    Hypothyroidism    Migraines    OA (osteoarthritis)    Obstructive sleep apnea    Overweight(278.02)    PONV (postoperative nausea and vomiting)    Rheumatoid arthritis (HCC)    RLS (restless legs syndrome)    Sarcoidosis    in remission for over ten years    Past Surgical History:  Procedure Laterality Date   Bilateral tubal ligation     CARPAL TUNNEL RELEASE Left 06/20/2014   Procedure: LEFT CARPAL TUNNEL RELEASE;  Surgeon: Daryll Brod, MD;  Location: [facility];  Service: Orthopedics;  Laterality: Left;   CARPAL TUNNEL RELEASE Right 11/11/2016   Procedure: CARPAL TUNNEL RELEASE, right;  Surgeon: Daryll Brod, MD;  Location: [facility];  Service: Orthopedics;  Laterality: Right;   CESAREAN SECTION     x 2   CHOLECYSTECTOMY N/A 10/26/2017   Procedure: LAPAROSCOPIC CHOLECYSTECTOMY;  Surgeon: Aviva Signs, MD;  Location: [facility];  Service: General;  Laterality: N/A;   COLONOSCOPY  11/20/2011   DR. Maribella Kuna: Hemorrhoids   DISTAL INTERPHALANGEAL JOINT FUSION Left 06/20/2014   Procedure: DISTAL INTERPHALANGEAL JOINT FUSION LEFT INDEX, MIDDLE FINGERS;  Surgeon: Daryll Brod, MD;  Location: [facility];  Service: Orthopedics;  Laterality: Left;   DISTAL  INTERPHALANGEAL JOINT FUSION Right 11/11/2016   Procedure: DISTAL INTERPHALANGEAL JOINT FUSION right index and ring;  Surgeon: Daryll Brod, MD;  Location: [facility];  Service: Orthopedics;  Laterality: Right;   Left knee  melanoma excsion     LYMPH NODE BIOPSY  1992   removed from chest area via ant neck excision for biopsy- sarcoidosis dx   REPLACEMENT TOTAL KNEE Right 2021   skin tag removed     perianal, Dr. Glo Herring   spleen removed     secondary to [place]  2003   Left ovary removed    Prior to Admission medications   Medication Sig Start Date End Date Taking? Authorizing Provider  amLODipine (NORVASC) 5 MG tablet Take 1 tablet (5 mg total) by mouth daily. 06/13/21  Yes Estill Dooms, NP  cetirizine (ZYRTEC) 10 MG tablet Take 10 mg by mouth daily.   Yes [provider]  diclofenac (VOLTAREN) 75 MG EC tablet Take by mouth 2 (two) times daily. 12/24/20  Yes [provider]  escitalopram (LEXAPRO) 10 MG tablet Take 1 tablet (10 mg total) by mouth daily. 06/13/21  Yes Derrek Monaco A, NP  hydroxychloroquine (PLAQUENIL) 200 MG tablet Take 200 mg by mouth 2 (two) times daily. 12/24/20  Yes [provider]  ketoconazole (NIZORAL) 2 % cream Apply 1 Application topically 2 (two) times daily. To rectum Patient taking  differently: Apply 1 Application topically 2 (two) times daily as needed (yeast). To rectum 06/11/22  Yes Annitta Needs, NP  levothyroxine (SYNTHROID) 50 MCG tablet TAKE 1 TABLET BY MOUTH EACH DAY 06/04/22  Yes Derrek Monaco A, NP  omeprazole (PRILOSEC OTC) 20 MG tablet Take 20 mg by mouth daily.   Yes [provider]  pramipexole (MIRAPEX) 0.5 MG tablet TAKE 1 TABLET BY MOUTH  TWICE DAILY 06/04/22  Yes Derrek Monaco A, NP  simvastatin (ZOCOR) 20 MG tablet Take 1 tablet (20 mg total) by mouth daily. 06/13/21  Yes Estill Dooms, NP  Sod Picosulfate-Mag Ox-Cit Acd  (CLENPIQ) 10-3.5-12 MG-GM -GM/175ML SOLN Take 1 kit by mouth as directed. 06/11/22  Yes Gwendolyn Mclees, [name], MD  valACYclovir (VALTREX) 1000 MG tablet TAKE 2 TABLETS BY MOUTH THE DAY OF FEVER BLISTERS AND  THEN 2 TABLETS THE NEXT DAY 11/05/21  Yes Estill Dooms, NP    Allergies as of 06/11/2022   (No Known Allergies)    Family History  Problem Relation Age of Onset   Heart attack Mother 38       deceased   Diabetes Mother    Heart attack Father 78       deceased   Breast cancer Sister 42   Diabetes Sister    Cancer Sister    Diabetes Sister    Diabetes Sister    Heart attack Sister    Other Brother        Black lung   Liver disease Maternal Grandmother        ???   Diabetes Maternal Grandmother    Diabetes Paternal 23    Asthma Son    Other Son        killed in MVA   Colon cancer Neg Hx    Ovarian cancer Neg Hx    Colon polyps Neg Hx        unknown    Social History   Socioeconomic History   Marital status: Married    Spouse name: Not on file   Number of children: 2   Years of education: Not on file   Highest education level: Not on file  Occupational History   Occupation: Lobbyist for the state of [place]    Employer: [employer]  Tobacco Use   Smoking status: Never    Passive exposure: Past   Smokeless tobacco: Never  Vaping Use   Vaping Use: Never used  Substance and Sexual Activity   Alcohol use: Yes    Alcohol/week: 0.0 standard drinks of alcohol    Comment: rarely- once or twice a year   Drug use: No   Sexual activity: Not Currently    Birth control/protection: Surgical    Comment: hyst  Other Topics Concern   Not on file  Social History Narrative   Son, deceased age 3, 82.   Social Determinants of Health   Financial Resource Strain: Low Risk  (06/13/2021)   Overall Financial Resource Strain (CARDIA)    Difficulty of Paying Living Expenses: Not hard at all  Food Insecurity: No Food Insecurity  (06/13/2021)   Hunger Vital Sign    Worried About Running Out of Food in the Last Year: Never true    Ran Out of Food in the Last Year: Never true  Transportation Needs: No Transportation Needs (06/13/2021)   PRAPARE - Hydrologist (Medical): No    Lack of Transportation (Non-Medical): No  Physical Activity: Inactive (06/13/2021)   Exercise Vital Sign    Days of Exercise per Week: 0 days    Minutes of Exercise per Session: 0 min  Stress: Stress Concern Present (06/13/2021)   [place]    Feeling of Stress : To some extent  Social Connections: Socially Integrated (06/13/2021)   Social Connection and Isolation Panel [NHANES]    Frequency of Communication with Friends and Family: Three times a week    Frequency of Social Gatherings with Friends and Family: Once a week    Attends Religious Services: 1 to 4 times per year    Active Member of [employer] or Organizations: Yes    Attends Archivist Meetings: 1 to 4 times per year    Marital Status: Married  Human resources officer Violence: Not At Risk (06/13/2021)   Humiliation, Afraid, Rape, and Kick questionnaire    Fear of Current or Ex-Partner: No    Emotionally Abused: No    Physically Abused: No    Sexually Abused: No    Review of Systems: See HPI, otherwise negative ROS  Physical Exam: BP 130/85   Pulse 66   Temp 98 F (36.7 C) (Oral)   Resp 16   Ht '5\' 5"'  (1.651 m)   Wt 98.9 kg   SpO2 95%   BMI 36.28 kg/m  General:   Alert,  Well-developed, well-nourished, pleasant and cooperative in NAD Neck:  Supple; no masses or thyromegaly. No significant cervical adenopathy. Lungs:  Clear throughout to auscultation.   No wheezes, crackles, or rhonchi. No acute distress. Heart:  Regular rate and rhythm; no murmurs, clicks, rubs,  or gallops. Abdomen: Non-distended, normal bowel sounds.  Soft and nontender without appreciable mass or  hepatosplenomegaly.  Pulses:  Normal pulses noted. Extremities:  Without clubbing or edema.  Impression/Plan: 61 year old lady here for screening colonoscopy.  She has had a couple of bands put on in the office already and has noted significant improvement.The risks, benefits, limitations, alternatives and imponderables have been reviewed with the patient. Questions have been answered. All parties are agreeable.        Notice: This dictation was prepared with Dragon dictation along with smaller phrase technology. Any transcriptional errors that result from this process are unintentional and may not be corrected upon review.

## 2022-07-16 NOTE — Discharge Instructions (Signed)
  Colonoscopy Discharge Instructions  Read the instructions outlined below and refer to this sheet in the next few weeks. These discharge instructions provide you with general information on caring for yourself after you leave the hospital. Your doctor may also give you specific instructions. While your treatment has been planned according to the most current medical practices available, unavoidable complications occasionally occur. If you have any problems or questions after discharge, call Dr. Gala Romney at [phone]. ACTIVITY You may resume your regular activity, but move at a slower pace for the next 24 hours.  Take frequent rest periods for the next 24 hours.  Walking will help get rid of the air and reduce the bloated feeling in your belly (abdomen).  No driving for 24 hours (because of the medicine (anesthesia) used during the test).   Do not sign any important legal documents or operate any machinery for 24 hours (because of the anesthesia used during the test).  NUTRITION Drink plenty of fluids.  You may resume your normal diet as instructed by your doctor.  Begin with a light meal and progress to your normal diet. Heavy or fried foods are harder to digest and may make you feel sick to your stomach (nauseated).  Avoid alcoholic beverages for 24 hours or as instructed.  MEDICATIONS You may resume your normal medications unless your doctor tells you otherwise.  WHAT YOU CAN EXPECT TODAY Some feelings of bloating in the abdomen.  Passage of more gas than usual.  Spotting of blood in your stool or on the toilet paper.  IF YOU HAD POLYPS REMOVED DURING THE COLONOSCOPY: No aspirin products for 7 days or as instructed.  No alcohol for 7 days or as instructed.  Eat a soft diet for the next 24 hours.  FINDING OUT THE RESULTS OF YOUR TEST Not all test results are available during your visit. If your test results are not back during the visit, make an appointment with your caregiver to find out the  results. Do not assume everything is normal if you have not heard from your caregiver or the medical facility. It is important for you to follow up on all of your test results.  SEEK IMMEDIATE MEDICAL ATTENTION IF: You have more than a spotting of blood in your stool.  Your belly is swollen (abdominal distention).  You are nauseated or vomiting.  You have a temperature over 101.  You have abdominal pain or discomfort that is severe or gets worse throughout the day.      2 small polyps removed from your colon   further recommendations to follow pending review of pathology report  Hemorrhoids are looking good.  You should continue to follow-up for additional banding's as needed   at patient request, I called Jeri at [phone] findings and recommendations

## 2022-07-16 NOTE — Anesthesia Preprocedure Evaluation (Signed)
Anesthesia Evaluation  Patient identified by MRN, date of birth, ID band Patient awake    Reviewed: Allergy & Precautions, H&P , NPO status , Patient's Chart, lab work & pertinent test results, reviewed documented beta blocker date and time   History of Anesthesia Complications (+) PONV and history of anesthetic complications  Airway Mallampati: II  TM Distance: >3 FB Neck ROM: full    Dental no notable dental hx.    Pulmonary sleep apnea and Continuous Positive Airway Pressure Ventilation ,    Pulmonary exam normal breath sounds clear to auscultation       Cardiovascular Exercise Tolerance: Good hypertension, negative cardio ROS   Rhythm:regular Rate:Normal     Neuro/Psych  Headaches, PSYCHIATRIC DISORDERS Anxiety Depression    GI/Hepatic Neg liver ROS, GERD  Medicated,  Endo/Other  Hypothyroidism   Renal/GU negative Renal ROS  negative genitourinary   Musculoskeletal   Abdominal   Peds  Hematology negative hematology ROS (+)   Anesthesia Other Findings   Reproductive/Obstetrics negative OB ROS                             Anesthesia Physical Anesthesia Plan  ASA: 3  Anesthesia Plan: General   Post-op Pain Management:    Induction:   PONV Risk Score and Plan: Propofol infusion  Airway Management Planned:   Additional Equipment:   Intra-op Plan:   Post-operative Plan:   Informed Consent: I have reviewed the patients History and Physical, chart, labs and discussed the procedure including the risks, benefits and alternatives for the proposed anesthesia with the patient or authorized representative who has indicated his/her understanding and acceptance.     Dental Advisory Given  Plan Discussed with: CRNA  Anesthesia Plan Comments:         Anesthesia Quick Evaluation

## 2022-07-16 NOTE — Op Note (Signed)
[facility] Patient Name: Samantha Eaton Procedure Date: 07/16/2022 9:50 AM MRN: [id number] Date of Birth: [birth year] Attending MD: Norvel Richards , MD CSN: [id number] Age: 61 Admit Type: Outpatient Procedure:                Colonoscopy Indications:              Screening for colorectal malignant neoplasm Providers:                Norvel Richards, MD, Janeece Riggers, RN, Raphael Gibney, Technician Referring MD:              Medicines:                Propofol per Anesthesia Complications:            No immediate complications. Estimated Blood Loss:     Estimated blood loss was minimal. Procedure:                Pre-Anesthesia Assessment:                           - Prior to the procedure, a History and Physical                            was performed, and patient medications and                            allergies were reviewed. The patient's tolerance of                            previous anesthesia was also reviewed. The risks                            and benefits of the procedure and the sedation                            options and risks were discussed with the patient.                            All questions were answered, and informed consent                            was obtained. Prior Anticoagulants: The patient has                            taken no previous anticoagulant or antiplatelet                            agents. ASA Grade Assessment: III - A patient with                            severe systemic disease. After reviewing the risks  and benefits, the patient was deemed in                            satisfactory condition to undergo the procedure.                           After obtaining informed consent, the colonoscope                            was passed under direct vision. Throughout the                            procedure, the patient's blood pressure, pulse, and                             oxygen saturations were monitored continuously. The                            [id number]) scope was introduced through the                            anus and advanced to the the cecum, identified by                            appendiceal orifice and ileocecal valve. The                            colonoscopy was performed without difficulty. The                            patient tolerated the procedure well. The quality                            of the bowel preparation was adequate. The                            ileocecal valve, appendiceal orifice, and rectum                            were photographed. The entire colon was well                            visualized. Scope In: 97:02:63 AM Scope Out: 10:26:45 AM Scope Withdrawal Time: 0 hours 12 minutes 11 seconds  Total Procedure Duration: 0 hours 20 minutes 34 seconds  Findings:      The perianal and digital rectal examinations were normal aside from a       small grade 3 hemorrhoid tag..      Two sessile polyps were found in the rectum, distal rectum and splenic       flexure. The polyps were 3 to 4 mm in size. These polyps were removed       with a cold snare. Resection and retrieval were complete. Estimated       blood loss was minimal.      The exam was otherwise  without abnormality on direct and retroflexion       views aside from the area of ulceration at the anal verge consistent       with the prior site of hemorrhoid banding.. Impression:               - Two 3 to 4 mm polyps in the rectum, in the distal                            rectum and at the splenic flexure, removed with a                            cold snare. Resected and retrieved.                           - The examination was otherwise normal on direct                            and retroflexion views. Moderate Sedation:      Moderate (conscious) sedation was personally administered by an       anesthesia professional. The following parameters  were monitored: oxygen       saturation, heart rate, blood pressure, respiratory rate, EKG, adequacy       of pulmonary ventilation, and response to care. Recommendation:           - Patient has a contact number available for                            emergencies. The signs and symptoms of potential                            delayed complications were discussed with the                            patient. Return to normal activities tomorrow.                            Written discharge instructions were provided to the                            patient.                           - Resume previous diet. Patient's hemorrhoid                            disease has improved with banding. She should                            return to the office for continued banding as                            appropriate.                           - Return to my office (date not yet determined).  Timing of repeat colonoscopy to be determined by                            results of pathology. Procedure Code(s):        --- Professional ---                           [phone], Colonoscopy, flexible; with removal of                            tumor(s), polyp(s), or other lesion(s) by snare                            technique Diagnosis Code(s):        --- Professional ---                           Z12.11, Encounter for screening for malignant                            neoplasm of colon                           K62.1, Rectal polyp                           K63.5, Polyp of colon CPT copyright 2019 American Medical Association. All rights reserved. The codes documented in this report are preliminary and upon coder review may  be revised to meet current compliance requirements. [name]. [name], MD Norvel Richards, MD 07/16/2022 10:37:34 AM This report has been signed electronically. Number of Addenda: 0

## 2022-07-17 NOTE — Anesthesia Postprocedure Evaluation (Signed)
Anesthesia Post Note  Patient: Samantha Eaton  Procedure(s) Performed: COLONOSCOPY WITH PROPOFOL POLYPECTOMY  Patient location during evaluation: Phase II Anesthesia Type: General Level of consciousness: awake Pain management: pain level controlled Vital Signs Assessment: post-procedure vital signs reviewed and stable Respiratory status: spontaneous breathing and respiratory function stable Cardiovascular status: blood pressure returned to baseline and stable Postop Assessment: no headache and no apparent nausea or vomiting Anesthetic complications: no Comments: Late entry   No notable events documented.   Last Vitals:  Vitals:   07/16/22 0859 07/16/22 1030  BP: 130/85 117/63  Pulse: 66 67  Resp: 16 20  Temp: 36.7 C 36.4 C  SpO2: 95% 93%    Last Pain:  Vitals:   07/16/22 1030  TempSrc: Oral  PainSc: 0-No pain                 Louann Sjogren

## 2022-07-18 LAB — SURGICAL PATHOLOGY

## 2022-07-23 ENCOUNTER — Encounter (HOSPITAL_COMMUNITY): Payer: Self-pay | Admitting: Internal Medicine

## 2022-07-23 ENCOUNTER — Encounter: Payer: Self-pay | Admitting: Internal Medicine

## 2022-07-31 ENCOUNTER — Encounter: Payer: PRIVATE HEALTH INSURANCE | Admitting: Gastroenterology

## 2022-10-02 ENCOUNTER — Ambulatory Visit (INDEPENDENT_AMBULATORY_CARE_PROVIDER_SITE_OTHER): Payer: BC Managed Care – PPO | Admitting: Gastroenterology

## 2022-10-02 ENCOUNTER — Encounter: Payer: Self-pay | Admitting: Gastroenterology

## 2022-10-02 VITALS — BP 115/77 | HR 73 | Temp 97.2°F | Ht 65.0 in | Wt 223.4 lb

## 2022-10-02 DIAGNOSIS — K642 Third degree hemorrhoids: Secondary | ICD-10-CM

## 2022-10-02 MED ORDER — KETOCONAZOLE 2 % EX CREA
1.0000 | TOPICAL_CREAM | Freq: Two times a day (BID) | CUTANEOUS | 1 refills | Status: AC | PRN
Start: 2022-10-02 — End: ?

## 2022-10-02 NOTE — Progress Notes (Signed)
      [facility] BANDING PROCEDURE NOTE  Samantha Eaton is a 61 y.o. female presenting today for consideration of hemorrhoid banding. Last colonoscopy Aug 2023 with adenomas. She has had left lateral banding.   The patient presents with symptomatic grade 3 hemorrhoids, unresponsive to maximal medical therapy, requesting rubber band ligation of her hemorrhoidal disease. All risks, benefits, and alternative forms of therapy were described and informed consent was obtained.  The decision was made to band the right posterior internal hemorrhoid, and the [place] was used to perform band ligation without complication. Digital anorectal examination was then performed to assure proper positioning of the band, and to adjust the banded tissue as required. The patient was discharged home without pain or other issues. Dietary and behavioral recommendations were given, along with follow-up instructions. The patient will return in several weeks for followup and possible additional banding as required. We will perform anoscopy at next visit.   No complications were encountered and the patient tolerated the procedure well.   Annitta Needs, PhD, ANP-BC [facility]

## 2022-10-02 NOTE — Patient Instructions (Signed)
  Please avoid straining.  You should limit your toilet time to 2-3 minutes at the most.   I recommend Benefiber 2 teaspoons each morning in the beverage of your choice!  Please call me with any concerns or issues!  I will see you in follow-up for additional banding in several weeks.  I enjoyed seeing you again today! As you know, I value our relationship and want to provide genuine, compassionate, and quality care. I welcome your feedback. If you receive a survey regarding your visit,  I greatly appreciate you taking time to fill this out. See you next time!  Kyrell Ruacho W. Shahira Fiske, PhD, ANP-BC [facility]         

## 2022-10-16 ENCOUNTER — Encounter: Payer: BC Managed Care – PPO | Admitting: Gastroenterology

## 2022-12-05 ENCOUNTER — Ambulatory Visit: Payer: Self-pay | Admitting: *Deleted

## 2022-12-05 NOTE — Telephone Encounter (Signed)
  Chief Complaint: covid positive. O 2 sat at 95% now fluctuating  Symptoms: cough yellow green sputum. Headache fever 99 now. Patient called PCP for advise . O2 sat at 95% at this time. Advised if below 92% go to ED. Husband reports O 2 sat fluctuating from 88-90% but while on phone with NT at 95% on room air  Frequency: sx started approx. 12/03/22 Pertinent Negatives: Patient denies chest pain per husband, no difficulty breathing. Temp 99 at this time.  Disposition: '[]'$ ED /'[]'$ Urgent Care (no appt availability in office) / '[]'$ Appointment(In office/virtual)/ '[]'$  [facility]/ '[]'$ Home Care/ '[]'$ Refused Recommended Disposition /'[]'$ [facility]/ '[x]'$  Follow-up with PCP Additional Notes:   Recommended to f/u with PCP and as recommended by PCP if O2 sat below 92% go to ED for evaluation.    Reason for Disposition  Oxygen level (e.g., pulse oximetry) 91 to 94 percent  Answer Assessment - Initial Assessment Questions 1. COVID-19 DIAGNOSIS: "How do you know that you have COVID?" (e.g., positive lab test or self-test, diagnosed by doctor or NP/PA, symptoms after exposure).     Covid positive at home test last night 12/04/22 2. COVID-19 EXPOSURE: "Was there any known exposure to COVID before the symptoms began?" CDC Definition of close contact: within 6 feet (2 meters) for a total of 15 minutes or more over a 24-hour period.      na 3. ONSET: "When did the COVID-19 symptoms start?"      Approx. 12/03/22 4. WORST SYMPTOM: "What is your worst symptom?" (e.g., cough, fever, shortness of breath, muscle aches)     Cough , fever, headache 5. COUGH: "Do you have a cough?" If Yes, ask: "How bad is the cough?"       Yes productive yellow to green sputum 6. FEVER: "Do you have a fever?" If Yes, ask: "What is your temperature, how was it measured, and when did it start?"     99 now  7. RESPIRATORY STATUS: "Describe your breathing?" (e.g., normal; shortness of breath, wheezing, unable to speak)       normal 8. BETTER-SAME-WORSE: "Are you getting better, staying the same or getting worse compared to yesterday?"  If getting worse, ask, "In what way?"     O 2 sat fluctuating between mid 90's to 88 9. OTHER SYMPTOMS: "Do you have any other symptoms?"  (e.g., chills, fatigue, headache, loss of smell or taste, muscle pain, sore throat)     Headache yesterday , cough productive yellowish green  10. HIGH RISK DISEASE: "Do you have any chronic medical problems?" (e.g., asthma, heart or lung disease, weak immune system, obesity, etc.)       See hx  11. VACCINE: "Have you had the COVID-19 vaccine?" If Yes, ask: "Which one, how many shots, when did you get it?"       na 12. PREGNANCY: "Is there any chance you are pregnant?" "When was your last menstrual period?"       na 13. O2 SATURATION MONITOR:  "Do you use an oxygen saturation monitor (pulse oximeter) at home?" If Yes, ask "What is your reading (oxygen level) today?" "What is your usual oxygen saturation reading?" (e.g., 95%)       95% room air now  Protocols used: Coronavirus (COVID-19) Diagnosed or Suspected-A-AH

## 2022-12-09 ENCOUNTER — Other Ambulatory Visit: Payer: Self-pay | Admitting: Adult Health

## 2022-12-18 DIAGNOSIS — Z79899 Other long term (current) drug therapy: Secondary | ICD-10-CM | POA: Diagnosis not present

## 2022-12-18 DIAGNOSIS — M459 Ankylosing spondylitis of unspecified sites in spine: Secondary | ICD-10-CM | POA: Diagnosis not present

## 2023-02-04 DIAGNOSIS — Z1589 Genetic susceptibility to other disease: Secondary | ICD-10-CM | POA: Diagnosis not present

## 2023-02-04 DIAGNOSIS — M459 Ankylosing spondylitis of unspecified sites in spine: Secondary | ICD-10-CM | POA: Diagnosis not present

## 2023-02-04 DIAGNOSIS — M154 Erosive (osteo)arthritis: Secondary | ICD-10-CM | POA: Diagnosis not present

## 2023-02-04 DIAGNOSIS — D869 Sarcoidosis, unspecified: Secondary | ICD-10-CM | POA: Diagnosis not present

## 2023-02-05 DIAGNOSIS — H40003 Preglaucoma, unspecified, bilateral: Secondary | ICD-10-CM | POA: Diagnosis not present

## 2023-02-05 DIAGNOSIS — H2513 Age-related nuclear cataract, bilateral: Secondary | ICD-10-CM | POA: Diagnosis not present

## 2023-02-05 DIAGNOSIS — H52203 Unspecified astigmatism, bilateral: Secondary | ICD-10-CM | POA: Diagnosis not present

## 2023-02-05 DIAGNOSIS — Z79899 Other long term (current) drug therapy: Secondary | ICD-10-CM | POA: Diagnosis not present

## 2023-02-05 DIAGNOSIS — H5213 Myopia, bilateral: Secondary | ICD-10-CM | POA: Diagnosis not present

## 2023-02-05 DIAGNOSIS — M069 Rheumatoid arthritis, unspecified: Secondary | ICD-10-CM | POA: Diagnosis not present

## 2023-02-05 DIAGNOSIS — H524 Presbyopia: Secondary | ICD-10-CM | POA: Diagnosis not present

## 2023-03-26 DIAGNOSIS — M154 Erosive (osteo)arthritis: Secondary | ICD-10-CM | POA: Diagnosis not present

## 2023-03-26 DIAGNOSIS — D869 Sarcoidosis, unspecified: Secondary | ICD-10-CM | POA: Diagnosis not present

## 2023-03-26 DIAGNOSIS — M79641 Pain in right hand: Secondary | ICD-10-CM | POA: Diagnosis not present

## 2023-03-26 DIAGNOSIS — M459 Ankylosing spondylitis of unspecified sites in spine: Secondary | ICD-10-CM | POA: Diagnosis not present

## 2023-03-26 DIAGNOSIS — R5383 Other fatigue: Secondary | ICD-10-CM | POA: Diagnosis not present

## 2023-03-26 DIAGNOSIS — M79642 Pain in left hand: Secondary | ICD-10-CM | POA: Diagnosis not present

## 2023-03-26 DIAGNOSIS — Z1589 Genetic susceptibility to other disease: Secondary | ICD-10-CM | POA: Diagnosis not present

## 2023-05-06 ENCOUNTER — Other Ambulatory Visit: Payer: Self-pay | Admitting: Adult Health

## 2023-05-07 ENCOUNTER — Telehealth: Payer: Self-pay

## 2023-05-07 ENCOUNTER — Other Ambulatory Visit: Payer: Self-pay | Admitting: Internal Medicine

## 2023-05-07 DIAGNOSIS — Z1231 Encounter for screening mammogram for malignant neoplasm of breast: Secondary | ICD-10-CM

## 2023-05-07 MED ORDER — PRAMIPEXOLE DIHYDROCHLORIDE 0.5 MG PO TABS
0.5000 mg | ORAL_TABLET | Freq: Two times a day (BID) | ORAL | 3 refills | Status: DC
Start: 2023-05-07 — End: 2024-08-30

## 2023-05-07 MED ORDER — LEVOTHYROXINE SODIUM 50 MCG PO TABS
ORAL_TABLET | ORAL | 0 refills | Status: DC
Start: 2023-05-07 — End: 2023-07-28

## 2023-05-07 NOTE — Telephone Encounter (Signed)
Refilled Mirapex and synthroid, has appt 06/26/23

## 2023-05-07 NOTE — Telephone Encounter (Signed)
Pt needs refills on Levothyroxine and Mirapex. Has an appt 8/9. Thanks! JSY

## 2023-05-07 NOTE — Telephone Encounter (Signed)
Patient would like for a nurse to call her concerning some prescriptions through her mail order program.

## 2023-06-03 ENCOUNTER — Ambulatory Visit: Payer: BC Managed Care – PPO

## 2023-06-19 ENCOUNTER — Other Ambulatory Visit: Payer: Self-pay | Admitting: Adult Health

## 2023-06-23 ENCOUNTER — Ambulatory Visit: Payer: BC Managed Care – PPO

## 2023-06-24 ENCOUNTER — Ambulatory Visit
Admission: RE | Admit: 2023-06-24 | Discharge: 2023-06-24 | Disposition: A | Discharge status: Home / Self Care | Payer: BC Managed Care – PPO | Source: Ambulatory Visit | Attending: Internal Medicine | Admitting: Internal Medicine

## 2023-06-24 DIAGNOSIS — Z1231 Encounter for screening mammogram for malignant neoplasm of breast: Secondary | ICD-10-CM | POA: Diagnosis not present

## 2023-06-25 DIAGNOSIS — Z79899 Other long term (current) drug therapy: Secondary | ICD-10-CM | POA: Diagnosis not present

## 2023-06-25 DIAGNOSIS — Z1589 Genetic susceptibility to other disease: Secondary | ICD-10-CM | POA: Diagnosis not present

## 2023-06-25 DIAGNOSIS — D869 Sarcoidosis, unspecified: Secondary | ICD-10-CM | POA: Diagnosis not present

## 2023-06-25 DIAGNOSIS — M459 Ankylosing spondylitis of unspecified sites in spine: Secondary | ICD-10-CM | POA: Diagnosis not present

## 2023-06-26 ENCOUNTER — Encounter: Payer: Self-pay | Admitting: Adult Health

## 2023-06-26 ENCOUNTER — Ambulatory Visit (INDEPENDENT_AMBULATORY_CARE_PROVIDER_SITE_OTHER): Payer: BC Managed Care – PPO | Admitting: Adult Health

## 2023-06-26 VITALS — BP 112/77 | HR 74 | Ht 63.25 in | Wt 221.0 lb

## 2023-06-26 DIAGNOSIS — F419 Anxiety disorder, unspecified: Secondary | ICD-10-CM

## 2023-06-26 DIAGNOSIS — Z131 Encounter for screening for diabetes mellitus: Secondary | ICD-10-CM

## 2023-06-26 DIAGNOSIS — Z90710 Acquired absence of both cervix and uterus: Secondary | ICD-10-CM | POA: Insufficient documentation

## 2023-06-26 DIAGNOSIS — E039 Hypothyroidism, unspecified: Secondary | ICD-10-CM

## 2023-06-26 DIAGNOSIS — Z1211 Encounter for screening for malignant neoplasm of colon: Secondary | ICD-10-CM | POA: Diagnosis not present

## 2023-06-26 DIAGNOSIS — Z01419 Encounter for gynecological examination (general) (routine) without abnormal findings: Secondary | ICD-10-CM

## 2023-06-26 DIAGNOSIS — K649 Unspecified hemorrhoids: Secondary | ICD-10-CM

## 2023-06-26 DIAGNOSIS — I10 Essential (primary) hypertension: Secondary | ICD-10-CM

## 2023-06-26 DIAGNOSIS — E7800 Pure hypercholesterolemia, unspecified: Secondary | ICD-10-CM

## 2023-06-26 DIAGNOSIS — G2581 Restless legs syndrome: Secondary | ICD-10-CM

## 2023-06-26 LAB — HEMOCCULT GUIAC POC 1CARD (OFFICE): Fecal Occult Blood, POC: NEGATIVE

## 2023-06-26 NOTE — Progress Notes (Signed)
Patient ID: Samantha Eaton, female   DOB: Jul 26, 1961, 62 y.o.   MRN: [id number] History of Present Illness: Samantha Eaton is a 62 year old white female, married, sp hysterectomy in for a well woman gyn exam.  She has retired, but is active with 2 grand sons.  She saw Dr Lendon Colonel yesterday and had CBC and CMP. HGB 12.0, glucose 91, liver and kidney function was normal. MIL had stroke, so she has been going to hospital often.   PCP is Dr Ouida Sills.   Current Medications, Allergies, Past Medical History, Past Surgical History, Family History and Social History were reviewed in [name] record.     Review of Systems: Patient denies any headaches, hearing loss, fatigue, blurred vision, shortness of breath, chest pain, abdominal pain, problems with bowel movements,(has C/D) urination, or intercourse(not active). No joint pain or mood swings.  Has body aches and restless leg seems worse Has hemorrhoids and has had banding, but still gets itchy   Physical Exam:BP 112/77 (BP Location: Right Arm, Patient Position: Sitting, Cuff Size: Normal)   Pulse 74   Ht 5' 3.25" (1.607 m)   Wt 221 lb (100.2 kg)   BMI 38.84 kg/m   General:  Well developed, well nourished, no acute distress Skin:  Warm and dry,tan Neck:  Midline trachea, normal thyroid, good ROM, no lymphadenopathy,no carotid bruits heard  Lungs; Clear to auscultation bilaterally Breast:  No dominant palpable mass, retraction, or nipple discharge Cardiovascular: Regular rate and rhythm Abdomen:  Soft, non tender, no hepatosplenomegaly Pelvic:  External genitalia is normal in appearance, no lesions.  The vagina is pale. Urethra has no lesions or masses. The cervix and uterus are absent.  No adnexal masses or tenderness noted.Bladder is non tender, no masses felt. Rectal: Good sphincter tone, no polyps, + hemorrhoids felt.  Hemoccult negative. Extremities/musculoskeletal:  No swelling or varicosities noted, no clubbing or  cyanosis Psych:  No mood changes, alert and cooperative,seems happy AA is 1 Fall risk is low    06/26/2023   10:38 AM 06/13/2021    9:00 AM 12/23/2018    8:55 AM  Depression screen PHQ 2/9  Decreased Interest 0 0 0  Down, Depressed, Hopeless 0 1 0  PHQ - 2 Score 0 1 0  Altered sleeping 2 1   Tired, decreased energy 1 1   Change in appetite 0 0   Feeling bad or failure about yourself  0 0   Trouble concentrating 0 1   Moving slowly or fidgety/restless 0 0   Suicidal thoughts 0 0   PHQ-9 Score 3 4        06/26/2023   10:39 AM 06/13/2021    9:00 AM  GAD 7 : Generalized Anxiety Score  Nervous, Anxious, on Edge 0 1  Control/stop worrying 0 1  Worry too much - different things 0 1  Trouble relaxing 1 0  Restless 0 0  Easily annoyed or irritable 0 0  Afraid - awful might happen 0 0  Total GAD 7 Score 1 3      Upstream - 06/26/23 1046       Pregnancy Intention Screening   Does the patient want to become pregnant in the next year? N/A    Does the patient's partner want to become pregnant in the next year? N/A    Would the patient like to discuss contraceptive options today? N/A      Contraception Wrap Up   Current Method --   hyst  End Method --   hyst   Contraception Counseling Provided No             Examination chaperoned by Faith Rogue LPN   Impression and Plan: 1. Well woman exam with routine gynecological exam Physical in 1 year Had mammogram 06/24/23 has not resulted yet Needs to see Dr Ouida Sills  2. Encounter for screening fecal occult blood testing Hemoccult was negative  - POCT occult blood stool  3. Hemorrhoids, unspecified hemorrhoid type Sees Lewie Loron NP  4. Hypothyroidism, unspecified type Check labs Is on synthroid 50 mcg 1 daily  - TSH + free T4  5. Essential hypertension On Norvasc 5 mg 1 daily, has refills   6. Anxiety On lexapro 10 mg 1 daily, has refills   7. RLS (restless legs syndrome) Seems worse, will make appt to discuss with  Dr Ouida Sills, Is currently on mirapes 0.5 mg 1 bid, has refills   8. Screening for diabetes mellitus - Hemoglobin A1c  9. Elevated cholesterol On zocor 20 mg 1 daily has refills - Lipid panel  10. S/P hysterectomy

## 2023-07-27 ENCOUNTER — Other Ambulatory Visit: Payer: Self-pay | Admitting: Adult Health

## 2023-10-29 ENCOUNTER — Other Ambulatory Visit (HOSPITAL_COMMUNITY): Payer: Self-pay | Admitting: Internal Medicine

## 2023-10-29 DIAGNOSIS — R051 Acute cough: Secondary | ICD-10-CM | POA: Diagnosis not present

## 2023-10-29 DIAGNOSIS — E785 Hyperlipidemia, unspecified: Secondary | ICD-10-CM | POA: Diagnosis not present

## 2023-10-29 DIAGNOSIS — Z1589 Genetic susceptibility to other disease: Secondary | ICD-10-CM | POA: Diagnosis not present

## 2023-10-29 DIAGNOSIS — G2581 Restless legs syndrome: Secondary | ICD-10-CM | POA: Diagnosis not present

## 2023-10-29 DIAGNOSIS — M25562 Pain in left knee: Secondary | ICD-10-CM | POA: Diagnosis not present

## 2023-10-29 DIAGNOSIS — Z23 Encounter for immunization: Secondary | ICD-10-CM | POA: Diagnosis not present

## 2023-10-29 DIAGNOSIS — D869 Sarcoidosis, unspecified: Secondary | ICD-10-CM | POA: Diagnosis not present

## 2023-10-29 DIAGNOSIS — R059 Cough, unspecified: Secondary | ICD-10-CM

## 2023-10-29 DIAGNOSIS — M154 Erosive (osteo)arthritis: Secondary | ICD-10-CM | POA: Diagnosis not present

## 2023-10-29 DIAGNOSIS — E039 Hypothyroidism, unspecified: Secondary | ICD-10-CM | POA: Diagnosis not present

## 2023-10-29 DIAGNOSIS — M459 Ankylosing spondylitis of unspecified sites in spine: Secondary | ICD-10-CM | POA: Diagnosis not present

## 2023-11-05 ENCOUNTER — Ambulatory Visit (HOSPITAL_COMMUNITY)
Admission: RE | Admit: 2023-11-05 | Discharge: 2023-11-05 | Disposition: A | Discharge status: Home / Self Care | Payer: BC Managed Care – PPO | Source: Ambulatory Visit | Attending: Internal Medicine | Admitting: Internal Medicine

## 2023-11-05 DIAGNOSIS — R059 Cough, unspecified: Secondary | ICD-10-CM | POA: Insufficient documentation

## 2023-11-05 DIAGNOSIS — R918 Other nonspecific abnormal finding of lung field: Secondary | ICD-10-CM | POA: Diagnosis not present

## 2024-02-02 DIAGNOSIS — H40013 Open angle with borderline findings, low risk, bilateral: Secondary | ICD-10-CM | POA: Diagnosis not present

## 2024-02-12 DIAGNOSIS — M1991 Primary osteoarthritis, unspecified site: Secondary | ICD-10-CM | POA: Diagnosis not present

## 2024-02-12 DIAGNOSIS — D869 Sarcoidosis, unspecified: Secondary | ICD-10-CM | POA: Diagnosis not present

## 2024-02-12 DIAGNOSIS — Z79899 Other long term (current) drug therapy: Secondary | ICD-10-CM | POA: Diagnosis not present

## 2024-02-12 DIAGNOSIS — M459 Ankylosing spondylitis of unspecified sites in spine: Secondary | ICD-10-CM | POA: Diagnosis not present

## 2024-02-12 DIAGNOSIS — M25531 Pain in right wrist: Secondary | ICD-10-CM | POA: Diagnosis not present

## 2024-03-11 DIAGNOSIS — M459 Ankylosing spondylitis of unspecified sites in spine: Secondary | ICD-10-CM | POA: Diagnosis not present

## 2024-03-28 DIAGNOSIS — M25531 Pain in right wrist: Secondary | ICD-10-CM | POA: Diagnosis not present

## 2024-03-28 DIAGNOSIS — M459 Ankylosing spondylitis of unspecified sites in spine: Secondary | ICD-10-CM | POA: Diagnosis not present

## 2024-04-13 DIAGNOSIS — M1991 Primary osteoarthritis, unspecified site: Secondary | ICD-10-CM | POA: Diagnosis not present

## 2024-04-13 DIAGNOSIS — D869 Sarcoidosis, unspecified: Secondary | ICD-10-CM | POA: Diagnosis not present

## 2024-04-13 DIAGNOSIS — Z1589 Genetic susceptibility to other disease: Secondary | ICD-10-CM | POA: Diagnosis not present

## 2024-04-13 DIAGNOSIS — M459 Ankylosing spondylitis of unspecified sites in spine: Secondary | ICD-10-CM | POA: Diagnosis not present

## 2024-04-26 ENCOUNTER — Other Ambulatory Visit: Payer: Self-pay | Admitting: Adult Health

## 2024-05-11 DIAGNOSIS — M459 Ankylosing spondylitis of unspecified sites in spine: Secondary | ICD-10-CM | POA: Diagnosis not present

## 2024-05-25 ENCOUNTER — Other Ambulatory Visit: Payer: Self-pay | Admitting: Adult Health

## 2024-05-25 DIAGNOSIS — Z1231 Encounter for screening mammogram for malignant neoplasm of breast: Secondary | ICD-10-CM

## 2024-06-14 DIAGNOSIS — M459 Ankylosing spondylitis of unspecified sites in spine: Secondary | ICD-10-CM | POA: Diagnosis not present

## 2024-06-14 DIAGNOSIS — M154 Erosive (osteo)arthritis: Secondary | ICD-10-CM | POA: Diagnosis not present

## 2024-06-14 DIAGNOSIS — D869 Sarcoidosis, unspecified: Secondary | ICD-10-CM | POA: Diagnosis not present

## 2024-06-14 DIAGNOSIS — Z79899 Other long term (current) drug therapy: Secondary | ICD-10-CM | POA: Diagnosis not present

## 2024-06-29 ENCOUNTER — Ambulatory Visit: Admitting: Adult Health

## 2024-06-29 ENCOUNTER — Ambulatory Visit
Admission: RE | Admit: 2024-06-29 | Discharge: 2024-06-29 | Disposition: A | Discharge status: Home / Self Care | Source: Ambulatory Visit | Attending: Adult Health | Admitting: Adult Health

## 2024-06-29 DIAGNOSIS — Z1231 Encounter for screening mammogram for malignant neoplasm of breast: Secondary | ICD-10-CM

## 2024-07-01 ENCOUNTER — Ambulatory Visit: Payer: Self-pay | Admitting: Adult Health

## 2024-07-15 DIAGNOSIS — M459 Ankylosing spondylitis of unspecified sites in spine: Secondary | ICD-10-CM | POA: Diagnosis not present

## 2024-07-15 DIAGNOSIS — R5383 Other fatigue: Secondary | ICD-10-CM | POA: Diagnosis not present

## 2024-07-19 DIAGNOSIS — M25531 Pain in right wrist: Secondary | ICD-10-CM | POA: Diagnosis not present

## 2024-07-19 DIAGNOSIS — D869 Sarcoidosis, unspecified: Secondary | ICD-10-CM | POA: Diagnosis not present

## 2024-07-19 DIAGNOSIS — Z79899 Other long term (current) drug therapy: Secondary | ICD-10-CM | POA: Diagnosis not present

## 2024-07-19 DIAGNOSIS — M154 Erosive (osteo)arthritis: Secondary | ICD-10-CM | POA: Diagnosis not present

## 2024-07-19 DIAGNOSIS — M459 Ankylosing spondylitis of unspecified sites in spine: Secondary | ICD-10-CM | POA: Diagnosis not present

## 2024-08-02 ENCOUNTER — Telehealth: Payer: Self-pay | Admitting: Adult Health

## 2024-08-02 MED ORDER — LEVOTHYROXINE SODIUM 50 MCG PO TABS: ORAL_TABLET | ORAL | 0 refills | Status: DC

## 2024-08-02 NOTE — Addendum Note (Signed)
 Addended by: Kennard Fildes A on: 08/02/2024 12:33 PM   Modules accepted: Orders

## 2024-08-02 NOTE — Telephone Encounter (Addendum)
 Pt had to reschedule appt with Delon. Her appt now is in October. Pt is out of Levothyroxine , ran out Friday. Pt is on 75 mcg now. Will you send in enough to last her until she sees you? Thanks! JSY

## 2024-08-02 NOTE — Telephone Encounter (Signed)
 Refilled levothyroxine  for 50 MCG 1 daily #30, has appt in [place], will do labs then

## 2024-08-02 NOTE — Telephone Encounter (Signed)
 Patient will be out of her medications before her next visit. Please advise.

## 2024-08-30 ENCOUNTER — Ambulatory Visit (INDEPENDENT_AMBULATORY_CARE_PROVIDER_SITE_OTHER): Admitting: Adult Health

## 2024-08-30 ENCOUNTER — Encounter: Payer: Self-pay | Admitting: Adult Health

## 2024-08-30 VITALS — BP 119/81 | HR 68 | Ht 65.0 in | Wt 206.5 lb

## 2024-08-30 DIAGNOSIS — F419 Anxiety disorder, unspecified: Secondary | ICD-10-CM

## 2024-08-30 DIAGNOSIS — Z01419 Encounter for gynecological examination (general) (routine) without abnormal findings: Secondary | ICD-10-CM | POA: Diagnosis not present

## 2024-08-30 DIAGNOSIS — R7309 Other abnormal glucose: Secondary | ICD-10-CM

## 2024-08-30 DIAGNOSIS — Z1331 Encounter for screening for depression: Secondary | ICD-10-CM | POA: Diagnosis not present

## 2024-08-30 DIAGNOSIS — I10 Essential (primary) hypertension: Secondary | ICD-10-CM | POA: Diagnosis not present

## 2024-08-30 DIAGNOSIS — E039 Hypothyroidism, unspecified: Secondary | ICD-10-CM

## 2024-08-30 DIAGNOSIS — Z90710 Acquired absence of both cervix and uterus: Secondary | ICD-10-CM

## 2024-08-30 DIAGNOSIS — E7800 Pure hypercholesterolemia, unspecified: Secondary | ICD-10-CM

## 2024-08-30 MED ORDER — ESCITALOPRAM OXALATE 20 MG PO TABS: 20.0000 mg | ORAL_TABLET | Freq: Every day | ORAL | 3 refills | Status: AC

## 2024-08-30 NOTE — Progress Notes (Signed)
 Patient ID: Samantha Eaton, female   DOB: 11-13-1961, 63 y.o.   MRN: [id number] History of Present Illness: Samantha Eaton is a 63 year old white female, married, sp hysterectomy in for a well woman gyn exam.  PCP is Dr Sheryle   Current Medications, Allergies, Past Medical History, Past Surgical History, Family History and Social History were reviewed in [employer] electronic medical record.     Review of Systems: Patient denies any headaches, hearing loss, fatigue, blurred vision, shortness of breath, chest pain, abdominal pain, problems with bowel movements, urination, or intercourse(not active). No mood swings.  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 She has RLS Not sleeping well, will wake up about 1 am Has family stress   Physical Exam:BP 119/81 (BP Location: Right Arm, Patient Position: Sitting, Cuff Size: Large)   Pulse 68   Ht 5' 5 (1.651 m)   Wt 206 lb 8 oz (93.7 kg)   BMI 34.36 kg/m   General:  Well developed, well nourished, no acute distress Skin:  Warm and dry Neck:  Midline trachea, normal thyroid , good ROM, no lymphadenopathy,no carotid bruits heard Lungs; Clear to auscultation bilaterally Breast:  No dominant palpable mass, retraction, or nipple discharge Cardiovascular: Regular rate and rhythm Abdomen:  Soft, non tender, no hepatosplenomegaly Pelvic:  External genitalia is normal in appearance, no lesions.  The vagina is pale. Urethra has no lesions or masses. The cervix and uterus are absent.  No adnexal masses or tenderness noted.Bladder is non tender, no masses felt. Rectal:Deferred, she had hemorrhoids banded last year Extremities/musculoskeletal:  No swelling or varicosities noted, no clubbing or cyanosis Psych:  No mood changes, alert and cooperative,seems happy AA is 1 Fall risk is low    08/30/2024    9:49 AM 06/26/2023   10:38 AM 06/13/2021    9:00 AM  Depression screen PHQ 2/9  Decreased Interest 0 0 0  Down, Depressed,  Hopeless 0 0 1  PHQ - 2 Score 0 0 1  Altered sleeping 1 2 1   Tired, decreased energy 1 1 1   Change in appetite 0 0 0  Feeling bad or failure about yourself  0 0 0  Trouble concentrating 1 0 1  Moving slowly or fidgety/restless 0 0 0  Suicidal thoughts 0 0 0  PHQ-9 Score 3 3 4        08/30/2024    9:49 AM 06/26/2023   10:39 AM 06/13/2021    9:00 AM  GAD 7 : Generalized Anxiety Score  Nervous, Anxious, on Edge 0 0 1  Control/stop worrying 1 0 1  Worry too much - different things 1 0 1  Trouble relaxing 1 1 0  Restless 1 0 0  Easily annoyed or irritable 1 0 0  Afraid - awful might happen 1 0 0  Total GAD 7 Score 6 1 3     Upstream - 08/30/24 0945       Pregnancy Intention Screening   Does the patient want to become pregnant in the next year? N/A    Does the patient's partner want to become pregnant in the next year? N/A    Would the patient like to discuss contraceptive options today? N/A      Contraception Wrap Up   Current Method Female Sterilization   hyst   End Method Female Sterilization   hyst   Contraception Counseling Provided No         Examination chaperoned by Clarita Salt LPN  Impression and plan: 1. Well woman exam with routine gynecological exam (Primary) Physical in 1 year Mammogram was negative 06/29/24 Will check labs  Stay active  - CBC - Comprehensive metabolic panel with GFR - Lipid panel  2. Hypothyroidism, unspecified type On synthroid  50 mcg 1 daily  - TSH + free T4  3. Essential hypertension BP is good on Norvasc  5 mg 1 daily   4. Anxiety Will increase lexapro  to 20 mg 1 daily  Meds ordered this encounter  Medications   escitalopram  (LEXAPRO ) 20 MG tablet    Sig: Take 1 tablet (20 mg total) by mouth daily.    Dispense:  90 tablet    Refill:  3    Supervising Provider:   JAYNE, LUTHER H [[id number]]    5. Elevated cholesterol On Zocor  20 mg 1 daily  - Lipid panel  6. S/P hysterectomy  7. Elevated hemoglobin A1c - Hemoglobin A1c

## 2024-08-31 LAB — LIPID PANEL
Chol/HDL Ratio: 2.4 ratio (ref 0.0–4.4)
Cholesterol, Total: 124 mg/dL (ref 100–199)
HDL: 51 mg/dL (ref >39)
LDL Chol Calc (NIH): 57 mg/dL (ref 0–99)
Triglycerides: 79 mg/dL (ref 0–149)
VLDL Cholesterol Cal: 16 mg/dL (ref 5–40)

## 2024-08-31 LAB — COMPREHENSIVE METABOLIC PANEL WITH GFR
ALT: 18 IU/L (ref 0–32)
AST: 24 IU/L (ref 0–40)
Albumin: 4.4 g/dL (ref 3.9–4.9)
Alkaline Phosphatase: 60 IU/L (ref 49–135)
BUN/Creatinine Ratio: 21 (ref 12–28)
BUN: 19 mg/dL (ref 8–27)
Bilirubin Total: 0.3 mg/dL (ref 0.0–1.2)
CO2: 23 mmol/L (ref 20–29)
Calcium: 9.8 mg/dL (ref 8.7–10.3)
Chloride: 103 mmol/L (ref 96–106)
Creatinine, Ser: 0.92 mg/dL (ref 0.57–1.00)
Globulin, Total: 2.4 g/dL (ref 1.5–4.5)
Glucose: 85 mg/dL (ref 70–99)
Potassium: 4.7 mmol/L (ref 3.5–5.2)
Sodium: 141 mmol/L (ref 134–144)
Total Protein: 6.8 g/dL (ref 6.0–8.5)
eGFR: 70 mL/min/1.73 (ref >59)

## 2024-08-31 LAB — TSH+FREE T4
Free T4: 1.18 ng/dL (ref 0.82–1.77)
TSH: 4.35 u[IU]/mL (ref 0.450–4.500)

## 2024-08-31 LAB — CBC
Hematocrit: 35.1 % (ref 34.0–46.6)
Hemoglobin: 11.3 g/dL (ref 11.1–15.9)
MCH: 31 pg (ref 26.6–33.0)
MCHC: 32.2 g/dL (ref 31.5–35.7)
MCV: 96 fL (ref 79–97)
Platelets: 545 x10E3/uL — ABNORMAL HIGH (ref 150–450)
RBC: 3.64 x10E6/uL — ABNORMAL LOW (ref 3.77–5.28)
RDW: 14 % (ref 11.7–15.4)
WBC: 10.3 x10E3/uL (ref 3.4–10.8)

## 2024-08-31 LAB — HEMOGLOBIN A1C
Est. average glucose Bld gHb Est-mCnc: 114 mg/dL
Hgb A1c MFr Bld: 5.6 % (ref 4.8–5.6)

## 2024-09-01 ENCOUNTER — Ambulatory Visit: Payer: Self-pay | Admitting: Adult Health

## 2024-09-01 MED ORDER — LEVOTHYROXINE SODIUM 50 MCG PO TABS: ORAL_TABLET | ORAL | 3 refills | Status: AC

## 2024-09-20 DIAGNOSIS — M459 Ankylosing spondylitis of unspecified sites in spine: Secondary | ICD-10-CM | POA: Diagnosis not present

## 2024-09-20 DIAGNOSIS — Z79899 Other long term (current) drug therapy: Secondary | ICD-10-CM | POA: Diagnosis not present

## 2024-09-20 DIAGNOSIS — M154 Erosive (osteo)arthritis: Secondary | ICD-10-CM | POA: Diagnosis not present

## 2024-09-20 DIAGNOSIS — D869 Sarcoidosis, unspecified: Secondary | ICD-10-CM | POA: Diagnosis not present
